# Patient Record
Sex: Female | Born: 1960
Health system: Southern US, Community
[De-identification: ages and names within clinical notes are randomized; demographics above are authoritative.]

## PROBLEM LIST (undated history)

## (undated) DIAGNOSIS — E785 Hyperlipidemia, unspecified: Secondary | ICD-10-CM

## (undated) DIAGNOSIS — J449 Chronic obstructive pulmonary disease, unspecified: Secondary | ICD-10-CM

## (undated) DIAGNOSIS — T7840XA Allergy, unspecified, initial encounter: Secondary | ICD-10-CM

## (undated) HISTORY — DX: Chronic obstructive pulmonary disease, unspecified: J44.9

## (undated) HISTORY — DX: Allergy, unspecified, initial encounter: T78.40XA

## (undated) HISTORY — PX: NO PAST SURGERIES: SHX2092

## (undated) HISTORY — DX: Hyperlipidemia, unspecified: E78.5

---

## 2007-07-14 ENCOUNTER — Other Ambulatory Visit: Payer: Self-pay

## 2007-07-14 ENCOUNTER — Emergency Department: Payer: Self-pay | Admitting: Emergency Medicine

## 2007-08-12 ENCOUNTER — Ambulatory Visit: Payer: Self-pay | Admitting: Family Medicine

## 2009-03-03 ENCOUNTER — Ambulatory Visit: Payer: Self-pay | Admitting: Unknown Physician Specialty

## 2009-03-18 ENCOUNTER — Ambulatory Visit: Payer: Self-pay | Admitting: Unknown Physician Specialty

## 2016-07-18 DIAGNOSIS — D2270 Melanocytic nevi of unspecified lower limb, including hip: Secondary | ICD-10-CM | POA: Diagnosis not present

## 2016-12-08 DIAGNOSIS — M545 Low back pain: Secondary | ICD-10-CM | POA: Diagnosis not present

## 2016-12-28 ENCOUNTER — Encounter: Payer: Self-pay | Admitting: Unknown Physician Specialty

## 2016-12-28 ENCOUNTER — Ambulatory Visit (INDEPENDENT_AMBULATORY_CARE_PROVIDER_SITE_OTHER): Payer: BLUE CROSS/BLUE SHIELD | Admitting: Unknown Physician Specialty

## 2016-12-28 DIAGNOSIS — F17200 Nicotine dependence, unspecified, uncomplicated: Secondary | ICD-10-CM | POA: Diagnosis not present

## 2016-12-28 DIAGNOSIS — E7800 Pure hypercholesterolemia, unspecified: Secondary | ICD-10-CM | POA: Insufficient documentation

## 2016-12-28 LAB — LIPID PANEL PICCOLO, WAIVED
CHOL/HDL RATIO PICCOLO,WAIVE: 2.7 mg/dL
Cholesterol Piccolo, Waived: 219 mg/dL — ABNORMAL HIGH (ref <200)
HDL CHOL PICCOLO, WAIVED: 82 mg/dL (ref >59)
LDL Chol Calc Piccolo Waived: 124 mg/dL — ABNORMAL HIGH (ref <100)
TRIGLYCERIDES PICCOLO,WAIVED: 68 mg/dL (ref <150)
VLDL Chol Calc Piccolo,Waive: 14 mg/dL (ref <30)

## 2016-12-28 NOTE — Assessment & Plan Note (Signed)
I have recommended absolute tobacco cessation. I have discussed various options available for assistance with tobacco cessation including over the counter methods (Nicotine gum, patch and lozenges). We also discussed prescription options (Chantix, Nicotine Inhaler / Nasal Spray). The patient is not interested in pursuing any prescription tobacco cessation options at this time.  

## 2016-12-28 NOTE — Progress Notes (Signed)
BP 117/77   Pulse 83   Temp 98.3 F (36.8 C)   Ht 5\' 3"  (1.6 m)   Wt 95 lb 1.6 oz (43.1 kg)   LMP 12/29/2007   SpO2 98%   BMI 16.85 kg/m    Subjective:    Patient ID: Natalie Lawson, female    DOB: 12-24-1960, 56 y.o.   MRN: [id number]  HPI: Natalie Lawson is a 56 y.o. female  Chief Complaint  Patient presents with  . Establish Care    no concerns or issues today, patient would like to come back for physical    Hypertension/hypercholesterol Pt states she had a health screening at work and found her cholesterol was a little high.   BP was also high and wanted to get it checked.  Pt states she is taking red yeast rice and going to the gym.  Taking Red Yeast Rice without problems.    Tobacco Smokes 1 ppd for quite some time.  Tried Nicorette lozenges which made her sick. No SOB or smokers cough  Social History   Social History  . Marital status: Single    Spouse name: N/A  . Number of children: N/A  . Years of education: N/A   Occupational History  . Not on file.   Social History Main Topics  . Smoking status: Current Every Day Smoker    Packs/day: 0.75    Types: Cigarettes  . Smokeless tobacco: Never Used  . Alcohol use No  . Drug use: No  . Sexual activity: No   Other Topics Concern  . Not on file   Social History Narrative  . No narrative on file   History reviewed. No pertinent family history.  History reviewed. No pertinent past medical history.  History reviewed. No pertinent surgical history.   Relevant past medical, surgical, family and social history reviewed and updated as indicated. Interim medical history since our last visit reviewed. Allergies and medications reviewed and updated.  Review of Systems  All other systems reviewed and are negative.   Per HPI unless specifically indicated above     Objective:    BP 117/77   Pulse 83   Temp 98.3 F (36.8 C)   Ht 5\' 3"  (1.6 m)   Wt 95 lb 1.6 oz (43.1 kg)   LMP 12/29/2007   SpO2 98%    BMI 16.85 kg/m   Wt Readings from Last 3 Encounters:  12/28/16 95 lb 1.6 oz (43.1 kg)    Physical Exam  Constitutional: She is oriented to person, place, and time. She appears well-developed and well-nourished. No distress.  HENT:  Head: Normocephalic and atraumatic.  Eyes: Conjunctivae and lids are normal. Right eye exhibits no discharge. Left eye exhibits no discharge. No scleral icterus.  Neck: Normal range of motion. Neck supple. No JVD present. Carotid bruit is not present.  Cardiovascular: Normal rate, regular rhythm and normal heart sounds.   Pulmonary/Chest: Effort normal and breath sounds normal.  Abdominal: Normal appearance. There is no splenomegaly or hepatomegaly.  Musculoskeletal: Normal range of motion.  Neurological: She is alert and oriented to person, place, and time.  Skin: Skin is warm, dry and intact. No rash noted. No pallor.  Psychiatric: She has a normal mood and affect. Her behavior is normal. Judgment and thought content normal.    No results found for this or any previous visit.    Assessment & Plan:   Problem List Items Addressed This Visit      Unprioritized  Hypercholesteremia    High at work.  Recheck today.  Already taking Red Yeast Rice.  ASCVD calculator not in statin benefit group      Relevant Orders   Lipid Panel Piccolo, Waived   Smoker     I have recommended absolute tobacco cessation. I have discussed various options available for assistance with tobacco cessation including over the counter methods (Nicotine gum, patch and lozenges). We also discussed prescription options (Chantix, Nicotine Inhaler / Nasal Spray). The patient is not interested in pursuing any prescription tobacco cessation options at this time.           Follow up plan: Return for schedule physical.

## 2016-12-28 NOTE — Assessment & Plan Note (Addendum)
High at work.  Recheck today.  Already taking Red Yeast Rice.  ASCVD calculator not in statin benefit group

## 2016-12-31 ENCOUNTER — Telehealth: Payer: Self-pay | Admitting: *Deleted

## 2016-12-31 ENCOUNTER — Encounter: Payer: Self-pay | Admitting: Unknown Physician Specialty

## 2016-12-31 NOTE — Telephone Encounter (Signed)
Received referral for low dose lung cancer screening CT scan. Message left at phone number listed in EMR for patient to call me back to facilitate scheduling scan.  

## 2017-01-01 ENCOUNTER — Telehealth: Payer: Self-pay | Admitting: *Deleted

## 2017-01-01 NOTE — Telephone Encounter (Signed)
Received referral for low dose lung cancer screening CT scan. Message left at phone number listed in EMR for patient to call me back to facilitate scheduling scan.  

## 2017-01-10 ENCOUNTER — Telehealth: Payer: Self-pay | Admitting: *Deleted

## 2017-01-10 NOTE — Telephone Encounter (Signed)
Received referral for low dose lung cancer screening CT scan. Message left at phone number listed in EMR for patient to call me back to facilitate scheduling scan.  

## 2017-01-18 ENCOUNTER — Encounter: Payer: Self-pay | Admitting: Unknown Physician Specialty

## 2017-01-18 ENCOUNTER — Ambulatory Visit (INDEPENDENT_AMBULATORY_CARE_PROVIDER_SITE_OTHER): Payer: BLUE CROSS/BLUE SHIELD | Admitting: Unknown Physician Specialty

## 2017-01-18 VITALS — BP 108/69 | HR 89 | Temp 98.1°F | Wt 95.2 lb

## 2017-01-18 DIAGNOSIS — Z0000 Encounter for general adult medical examination without abnormal findings: Secondary | ICD-10-CM | POA: Diagnosis not present

## 2017-01-18 DIAGNOSIS — R636 Underweight: Secondary | ICD-10-CM | POA: Diagnosis not present

## 2017-01-18 DIAGNOSIS — E7800 Pure hypercholesterolemia, unspecified: Secondary | ICD-10-CM | POA: Diagnosis not present

## 2017-01-18 DIAGNOSIS — F17200 Nicotine dependence, unspecified, uncomplicated: Secondary | ICD-10-CM

## 2017-01-18 NOTE — Assessment & Plan Note (Signed)
No change from previous weights

## 2017-01-18 NOTE — Progress Notes (Signed)
BP 108/69   Pulse 89   Temp 98.1 F (36.7 C) (Oral)   Wt 95 lb 3.2 oz (43.2 kg)   LMP 12/29/2007   SpO2 98%   BMI 16.86 kg/m    Subjective:    Patient ID: Natalie Lawson, female    DOB: 10/04/1960, 56 y.o.   MRN: [id number]  HPI: Natalie MemsSusan Nitta is a 56 y.o. female  Chief Complaint  Patient presents with  . Annual Exam    Hyperlipidemia Using medications (Red yeast rice) without problems: No Muscle aches  Diet compliance:Exercise: Going to the gym every day-   Social History   Socioeconomic History  . Marital status: Single    Spouse name: Not on file  . Number of children: Not on file  . Years of education: Not on file  . Highest education level: Not on file  Social Needs  . Financial resource strain: Not on file  . Food insecurity - worry: Not on file  . Food insecurity - inability: Not on file  . Transportation needs - medical: Not on file  . Transportation needs - non-medical: Not on file  Occupational History  . Not on file  Tobacco Use  . Smoking status: Current Every Day Smoker    Packs/day: 0.75    Types: Cigarettes  . Smokeless tobacco: Never Used  Substance and Sexual Activity  . Alcohol use: No  . Drug use: No  . Sexual activity: No  Other Topics Concern  . Not on file  Social History Narrative  . Not on file   History reviewed. No pertinent family history. History reviewed. No pertinent past medical history. History reviewed. No pertinent surgical history.  Relevant past medical, surgical, family and social history reviewed and updated as indicated. Interim medical history since our last visit reviewed. Allergies and medications reviewed and updated.  Review of Systems  Per HPI unless specifically indicated above     Objective:    BP 108/69   Pulse 89   Temp 98.1 F (36.7 C) (Oral)   Wt 95 lb 3.2 oz (43.2 kg)   LMP 12/29/2007   SpO2 98%   BMI 16.86 kg/m   Wt Readings from Last 3 Encounters:  01/18/17 95 lb 3.2 oz (43.2 kg)    12/28/16 95 lb 1.6 oz (43.1 kg)    Physical Exam  Constitutional: She is oriented to person, place, and time. She appears well-developed and well-nourished.  HENT:  Head: Normocephalic and atraumatic.  Eyes: Pupils are equal, round, and reactive to light. Right eye exhibits no discharge. Left eye exhibits no discharge. No scleral icterus.  Neck: Normal range of motion. Neck supple. Carotid bruit is not present. No thyromegaly present.  Cardiovascular: Normal rate, regular rhythm and normal heart sounds. Exam reveals no gallop and no friction rub.  No murmur heard. Pulmonary/Chest: Effort normal and breath sounds normal. No respiratory distress. She has no wheezes. She has no rales.  Abdominal: Soft. Bowel sounds are normal. There is no tenderness. There is no rebound.  Genitourinary: No breast swelling, tenderness or discharge.  Musculoskeletal: Normal range of motion.  Lymphadenopathy:    She has no cervical adenopathy.  Neurological: She is alert and oriented to person, place, and time.  Skin: Skin is warm, dry and intact. No rash noted.  Psychiatric: She has a normal mood and affect. Her speech is normal and behavior is normal. Judgment and thought content normal. Cognition and memory are normal.    Results for orders placed  or performed in visit on 12/28/16  Lipid Panel Piccolo, Arrow ElectronicsWaived  Result Value Ref Range   Cholesterol Piccolo, Waived 219 (H) <200 mg/dL   HDL Chol Piccolo, Waived 82 >59 mg/dL   Triglycerides Piccolo,Waived 68 <150 mg/dL   Chol/HDL Ratio Piccolo,Waive 2.7 mg/dL   LDL Chol Calc Piccolo Waived 124 (H) <100 mg/dL   VLDL Chol Calc Piccolo,Waive 14 <30 mg/dL      Assessment & Plan:   Problem List Items Addressed This Visit      Unprioritized   Hypercholesteremia    Check lipid panel      Smoker    Refusing low dose screening.   I have recommended absolute tobacco cessation. I have discussed various options available for assistance with tobacco cessation  including over the counter methods (Nicotine gum, patch and lozenges). We also discussed prescription options (Chantix, Nicotine Inhaler / Nasal Spray). The patient is not interested in pursuing any prescription tobacco cessation options at this time.       Underweight    No change from previous weights       Other Visit Diagnoses    Routine general medical examination at a health care facility    -  Primary   Relevant Orders   CBC with Differential/Platelet   Comprehensive metabolic panel   Lipid Panel w/o Chol/HDL Ratio   TSH   IGP, Aptima HPV, rfx 16/18,45   MM DIGITAL SCREENING BILATERAL       Follow up plan: Return in about 1 year (around 01/18/2018).

## 2017-01-18 NOTE — Assessment & Plan Note (Signed)
Check lipid panel  

## 2017-01-18 NOTE — Patient Instructions (Addendum)
Please do call to schedule your mammogram; the number to schedule one at either [facility] or [facility] is [phone]   Preventive Care 40-64 Years, Female Preventive care refers to lifestyle choices and visits with your health care provider that can promote health and wellness. What does preventive care include?  A yearly physical exam. This is also called an annual well check.  Dental exams once or twice a year.  Routine eye exams. Ask your health care provider how often you should have your eyes checked.  Personal lifestyle choices, including: ? Daily care of your teeth and gums. ? Regular physical activity. ? Eating a healthy diet. ? Avoiding tobacco and drug use. ? Limiting alcohol use. ? Practicing safe sex. ? Taking low-dose aspirin daily starting at age 3. ? Taking vitamin and mineral supplements as recommended by your health care provider. What happens during an annual well check? The services and screenings done by your health care provider during your annual well check will depend on your age, overall health, lifestyle risk factors, and family history of disease. Counseling Your health care provider may ask you questions about your:  Alcohol use.  Tobacco use.  Drug use.  Emotional well-being.  Home and relationship well-being.  Sexual activity.  Eating habits.  Work and work Statistician.  Method of birth control.  Menstrual cycle.  Pregnancy history.  Screening You may have the following tests or measurements:  Height, weight, and BMI.  Blood pressure.  Lipid and cholesterol levels. These may be checked every 5 years, or more frequently if you are over 82 years old.  Skin check.  Lung cancer screening. You may have this screening every year starting at age 68 if you have a 30-pack-year history of smoking and currently smoke or have quit within the past 15 years.  Fecal occult blood test (FOBT) of the stool.  You may have this test every year starting at age 36.  Flexible sigmoidoscopy or colonoscopy. You may have a sigmoidoscopy every 5 years or a colonoscopy every 10 years starting at age 7.  Hepatitis C blood test.  Hepatitis B blood test.  Sexually transmitted disease (STD) testing.  Diabetes screening. This is done by checking your blood sugar (glucose) after you have not eaten for a while (fasting). You may have this done every 1-3 years.  Mammogram. This may be done every 1-2 years. Talk to your health care provider about when you should start having regular mammograms. This may depend on whether you have a family history of breast cancer.  BRCA-related cancer screening. This may be done if you have a family history of breast, ovarian, tubal, or peritoneal cancers.  Pelvic exam and Pap test. This may be done every 3 years starting at age 14. Starting at age 29, this may be done every 5 years if you have a Pap test in combination with an HPV test.  Bone density scan. This is done to screen for osteoporosis. You may have this scan if you are at high risk for osteoporosis.  Discuss your test results, treatment options, and if necessary, the need for more tests with your health care provider. Vaccines Your health care provider may recommend certain vaccines, such as:  Influenza vaccine. This is recommended every year.  Tetanus, diphtheria, and acellular pertussis (Tdap, Td) vaccine. You may need a Td booster every 10 years.  Varicella vaccine. You may need this if you have not been vaccinated.  Zoster vaccine. You may  need this after age 11.  Measles, mumps, and rubella (MMR) vaccine. You may need at least one dose of MMR if you were born in 1957 or later. You may also need a second dose.  Pneumococcal 13-valent conjugate (PCV13) vaccine. You may need this if you have certain conditions and were not previously vaccinated.  Pneumococcal polysaccharide (PPSV23) vaccine. You may need  one or two doses if you smoke cigarettes or if you have certain conditions.  Meningococcal vaccine. You may need this if you have certain conditions.  Hepatitis A vaccine. You may need this if you have certain conditions or if you travel or work in places where you may be exposed to hepatitis A.  Hepatitis B vaccine. You may need this if you have certain conditions or if you travel or work in places where you may be exposed to hepatitis B.  Haemophilus influenzae type b (Hib) vaccine. You may need this if you have certain conditions.  Talk to your health care provider about which screenings and vaccines you need and how often you need them. This information is not intended to replace advice given to you by your health care provider. Make sure you discuss any questions you have with your health care provider. Document Released: 03/18/2015 Document Revised: 11/09/2015 Document Reviewed: 12/21/2014 Elsevier Interactive Patient Education  2017 [employer].

## 2017-01-18 NOTE — Assessment & Plan Note (Addendum)
Refusing low dose screening.   I have recommended absolute tobacco cessation. I have discussed various options available for assistance with tobacco cessation including over the counter methods (Nicotine gum, patch and lozenges). We also discussed prescription options (Chantix, Nicotine Inhaler / Nasal Spray). The patient is not interested in pursuing any prescription tobacco cessation options at this time.

## 2017-01-19 LAB — CBC WITH DIFFERENTIAL/PLATELET
BASOS: 0 %
Basophils Absolute: 0 10*3/uL (ref 0.0–0.2)
EOS (ABSOLUTE): 0.1 10*3/uL (ref 0.0–0.4)
Eos: 3 %
HEMATOCRIT: 37.1 % (ref 34.0–46.6)
Hemoglobin: 12.6 g/dL (ref 11.1–15.9)
IMMATURE GRANULOCYTES: 0 %
Immature Grans (Abs): 0 10*3/uL (ref 0.0–0.1)
LYMPHS ABS: 2 10*3/uL (ref 0.7–3.1)
Lymphs: 36 %
MCH: 28.1 pg (ref 26.6–33.0)
MCHC: 34 g/dL (ref 31.5–35.7)
MCV: 83 fL (ref 79–97)
MONOS ABS: 0.7 10*3/uL (ref 0.1–0.9)
Monocytes: 12 %
NEUTROS PCT: 49 %
Neutrophils Absolute: 2.8 10*3/uL (ref 1.4–7.0)
PLATELETS: 261 10*3/uL (ref 150–379)
RBC: 4.48 x10E6/uL (ref 3.77–5.28)
RDW: 16.8 % — ABNORMAL HIGH (ref 12.3–15.4)
WBC: 5.7 10*3/uL (ref 3.4–10.8)

## 2017-01-19 LAB — COMPREHENSIVE METABOLIC PANEL
ALT: 26 IU/L (ref 0–32)
AST: 35 IU/L (ref 0–40)
Albumin/Globulin Ratio: 1.9 (ref 1.2–2.2)
Albumin: 4.3 g/dL (ref 3.5–5.5)
Alkaline Phosphatase: 53 IU/L (ref 39–117)
BUN/Creatinine Ratio: 19 (ref 9–23)
BUN: 13 mg/dL (ref 6–24)
CALCIUM: 9.3 mg/dL (ref 8.7–10.2)
CHLORIDE: 101 mmol/L (ref 96–106)
CO2: 25 mmol/L (ref 20–29)
Creatinine, Ser: 0.7 mg/dL (ref 0.57–1.00)
GFR calc Af Amer: 113 mL/min/{1.73_m2} (ref >59)
GFR, EST NON AFRICAN AMERICAN: 98 mL/min/{1.73_m2} (ref >59)
GLOBULIN, TOTAL: 2.3 g/dL (ref 1.5–4.5)
Glucose: 85 mg/dL (ref 65–99)
Potassium: 4 mmol/L (ref 3.5–5.2)
SODIUM: 140 mmol/L (ref 134–144)
Total Protein: 6.6 g/dL (ref 6.0–8.5)

## 2017-01-19 LAB — LIPID PANEL W/O CHOL/HDL RATIO
CHOLESTEROL TOTAL: 207 mg/dL — AB (ref 100–199)
HDL: 74 mg/dL (ref >39)
LDL Calculated: 124 mg/dL — ABNORMAL HIGH (ref 0–99)
Triglycerides: 45 mg/dL (ref 0–149)
VLDL Cholesterol Cal: 9 mg/dL (ref 5–40)

## 2017-01-19 LAB — TSH: TSH: 3.33 u[IU]/mL (ref 0.450–4.500)

## 2017-01-21 LAB — IGP, APTIMA HPV, RFX 16/18,45
HPV APTIMA: NEGATIVE
PAP Smear Comment: 0

## 2017-01-22 ENCOUNTER — Encounter: Payer: Self-pay | Admitting: *Deleted

## 2017-02-03 DIAGNOSIS — R300 Dysuria: Secondary | ICD-10-CM | POA: Diagnosis not present

## 2017-02-03 DIAGNOSIS — F17200 Nicotine dependence, unspecified, uncomplicated: Secondary | ICD-10-CM | POA: Diagnosis not present

## 2017-02-03 DIAGNOSIS — N3000 Acute cystitis without hematuria: Secondary | ICD-10-CM | POA: Diagnosis not present

## 2017-08-22 DIAGNOSIS — Z008 Encounter for other general examination: Secondary | ICD-10-CM | POA: Diagnosis not present

## 2017-08-22 DIAGNOSIS — Z1331 Encounter for screening for depression: Secondary | ICD-10-CM | POA: Diagnosis not present

## 2017-08-22 DIAGNOSIS — Z1339 Encounter for screening examination for other mental health and behavioral disorders: Secondary | ICD-10-CM | POA: Diagnosis not present

## 2017-08-22 DIAGNOSIS — F17210 Nicotine dependence, cigarettes, uncomplicated: Secondary | ICD-10-CM | POA: Diagnosis not present

## 2017-08-22 DIAGNOSIS — Z716 Tobacco abuse counseling: Secondary | ICD-10-CM | POA: Diagnosis not present

## 2017-09-17 DIAGNOSIS — Z716 Tobacco abuse counseling: Secondary | ICD-10-CM | POA: Diagnosis not present

## 2017-09-17 DIAGNOSIS — Z008 Encounter for other general examination: Secondary | ICD-10-CM | POA: Diagnosis not present

## 2017-09-17 DIAGNOSIS — F17210 Nicotine dependence, cigarettes, uncomplicated: Secondary | ICD-10-CM | POA: Diagnosis not present

## 2017-09-17 DIAGNOSIS — Z719 Counseling, unspecified: Secondary | ICD-10-CM | POA: Diagnosis not present

## 2017-10-22 DIAGNOSIS — F17210 Nicotine dependence, cigarettes, uncomplicated: Secondary | ICD-10-CM | POA: Diagnosis not present

## 2017-10-22 DIAGNOSIS — Z716 Tobacco abuse counseling: Secondary | ICD-10-CM | POA: Diagnosis not present

## 2017-11-19 DIAGNOSIS — F17210 Nicotine dependence, cigarettes, uncomplicated: Secondary | ICD-10-CM | POA: Diagnosis not present

## 2017-11-19 DIAGNOSIS — F418 Other specified anxiety disorders: Secondary | ICD-10-CM | POA: Diagnosis not present

## 2017-11-19 DIAGNOSIS — Z716 Tobacco abuse counseling: Secondary | ICD-10-CM | POA: Diagnosis not present

## 2017-12-19 DIAGNOSIS — Z716 Tobacco abuse counseling: Secondary | ICD-10-CM | POA: Diagnosis not present

## 2017-12-19 DIAGNOSIS — F418 Other specified anxiety disorders: Secondary | ICD-10-CM | POA: Diagnosis not present

## 2017-12-19 DIAGNOSIS — F17210 Nicotine dependence, cigarettes, uncomplicated: Secondary | ICD-10-CM | POA: Diagnosis not present

## 2018-02-11 DIAGNOSIS — F17210 Nicotine dependence, cigarettes, uncomplicated: Secondary | ICD-10-CM | POA: Diagnosis not present

## 2018-02-11 DIAGNOSIS — Z716 Tobacco abuse counseling: Secondary | ICD-10-CM | POA: Diagnosis not present

## 2018-02-11 DIAGNOSIS — F418 Other specified anxiety disorders: Secondary | ICD-10-CM | POA: Diagnosis not present

## 2018-03-02 DIAGNOSIS — J209 Acute bronchitis, unspecified: Secondary | ICD-10-CM | POA: Diagnosis not present

## 2018-04-10 DIAGNOSIS — F418 Other specified anxiety disorders: Secondary | ICD-10-CM | POA: Diagnosis not present

## 2018-04-10 DIAGNOSIS — F17210 Nicotine dependence, cigarettes, uncomplicated: Secondary | ICD-10-CM | POA: Diagnosis not present

## 2018-04-10 DIAGNOSIS — Z716 Tobacco abuse counseling: Secondary | ICD-10-CM | POA: Diagnosis not present

## 2018-04-29 DIAGNOSIS — F418 Other specified anxiety disorders: Secondary | ICD-10-CM | POA: Diagnosis not present

## 2018-04-29 DIAGNOSIS — Z716 Tobacco abuse counseling: Secondary | ICD-10-CM | POA: Diagnosis not present

## 2018-04-29 DIAGNOSIS — F17210 Nicotine dependence, cigarettes, uncomplicated: Secondary | ICD-10-CM | POA: Diagnosis not present

## 2018-05-13 DIAGNOSIS — Z716 Tobacco abuse counseling: Secondary | ICD-10-CM | POA: Diagnosis not present

## 2018-05-13 DIAGNOSIS — F17210 Nicotine dependence, cigarettes, uncomplicated: Secondary | ICD-10-CM | POA: Diagnosis not present

## 2018-07-17 DIAGNOSIS — F17210 Nicotine dependence, cigarettes, uncomplicated: Secondary | ICD-10-CM | POA: Diagnosis not present

## 2018-07-17 DIAGNOSIS — F418 Other specified anxiety disorders: Secondary | ICD-10-CM | POA: Diagnosis not present

## 2018-07-17 DIAGNOSIS — Z716 Tobacco abuse counseling: Secondary | ICD-10-CM | POA: Diagnosis not present

## 2018-10-28 DIAGNOSIS — Z139 Encounter for screening, unspecified: Secondary | ICD-10-CM | POA: Diagnosis not present

## 2018-10-28 DIAGNOSIS — Z0130 Encounter for examination of blood pressure without abnormal findings: Secondary | ICD-10-CM | POA: Diagnosis not present

## 2018-11-06 DIAGNOSIS — E559 Vitamin D deficiency, unspecified: Secondary | ICD-10-CM | POA: Diagnosis not present

## 2018-11-06 DIAGNOSIS — E785 Hyperlipidemia, unspecified: Secondary | ICD-10-CM | POA: Diagnosis not present

## 2018-12-18 DIAGNOSIS — Z23 Encounter for immunization: Secondary | ICD-10-CM | POA: Diagnosis not present

## 2018-12-25 DIAGNOSIS — Z716 Tobacco abuse counseling: Secondary | ICD-10-CM | POA: Diagnosis not present

## 2018-12-25 DIAGNOSIS — F418 Other specified anxiety disorders: Secondary | ICD-10-CM | POA: Diagnosis not present

## 2018-12-25 DIAGNOSIS — F17210 Nicotine dependence, cigarettes, uncomplicated: Secondary | ICD-10-CM | POA: Diagnosis not present

## 2019-02-10 DIAGNOSIS — E785 Hyperlipidemia, unspecified: Secondary | ICD-10-CM | POA: Diagnosis not present

## 2019-02-10 DIAGNOSIS — Z0130 Encounter for examination of blood pressure without abnormal findings: Secondary | ICD-10-CM | POA: Diagnosis not present

## 2019-02-10 DIAGNOSIS — E559 Vitamin D deficiency, unspecified: Secondary | ICD-10-CM | POA: Diagnosis not present

## 2019-02-10 DIAGNOSIS — Z79899 Other long term (current) drug therapy: Secondary | ICD-10-CM | POA: Diagnosis not present

## 2019-02-19 DIAGNOSIS — E785 Hyperlipidemia, unspecified: Secondary | ICD-10-CM | POA: Diagnosis not present

## 2019-02-19 DIAGNOSIS — Z716 Tobacco abuse counseling: Secondary | ICD-10-CM | POA: Diagnosis not present

## 2019-02-19 DIAGNOSIS — F17210 Nicotine dependence, cigarettes, uncomplicated: Secondary | ICD-10-CM | POA: Diagnosis not present

## 2019-02-19 DIAGNOSIS — E559 Vitamin D deficiency, unspecified: Secondary | ICD-10-CM | POA: Diagnosis not present

## 2020-04-26 DIAGNOSIS — J90 Pleural effusion, not elsewhere classified: Secondary | ICD-10-CM | POA: Diagnosis not present

## 2020-04-26 DIAGNOSIS — R059 Cough, unspecified: Secondary | ICD-10-CM | POA: Diagnosis not present

## 2020-04-26 DIAGNOSIS — J189 Pneumonia, unspecified organism: Secondary | ICD-10-CM | POA: Diagnosis not present

## 2020-05-02 ENCOUNTER — Telehealth: Payer: Self-pay

## 2020-05-02 NOTE — Telephone Encounter (Signed)
Copied from CRM [id number]. Topic: General - Other >> Apr 26, 2020  8:40 AM Tamela Oddi wrote: Reason for CRM: Former patient, over 3 years, called to schedule an appt.  Please call patient back to schedule with new provider at [phone]   VM NOT SET UP CALLED TO TRY TO SCHDULE IF PT HAS BLUE CROSS PROVIDERS TAKING NEW PT ARE NOT CREDENTIALED TO SEE [facility]

## 2020-09-27 LAB — LIPID PANEL
Cholesterol: 197 (ref 0–200)
HDL: 105 — AB (ref 35–70)
LDL Cholesterol: 76
Triglycerides: 80 (ref 40–160)

## 2020-09-27 LAB — BASIC METABOLIC PANEL WITH GFR
BUN: 11 (ref 4–21)
CO2: 29 — AB (ref 13–22)
Chloride: 102 (ref 99–108)
Creatinine: 0.7 (ref 0.5–1.1)
Glucose: 98
Potassium: 4.5 (ref 3.4–5.3)
Sodium: 140 (ref 137–147)

## 2020-09-27 LAB — CBC: RBC: 4.83 (ref 3.87–5.11)

## 2020-09-27 LAB — COMPREHENSIVE METABOLIC PANEL
Albumin: 4.3 (ref 3.5–5.0)
Calcium: 9.9 (ref 8.7–10.7)
Globulin: 2.6

## 2020-09-27 LAB — VITAMIN D 25 HYDROXY (VIT D DEFICIENCY, FRACTURES): Vit D, 25-Hydroxy: 66

## 2020-09-27 LAB — CBC AND DIFFERENTIAL
HCT: 43 (ref 36–46)
Hemoglobin: 13.9 (ref 12.0–16.0)
Platelets: 283 (ref 150–399)
WBC: 9.1

## 2020-09-27 LAB — TSH: TSH: 3.54 (ref 0.41–5.90)

## 2020-09-27 LAB — VITAMIN B12: Vitamin B-12: 824

## 2020-09-29 DIAGNOSIS — Z1231 Encounter for screening mammogram for malignant neoplasm of breast: Secondary | ICD-10-CM | POA: Diagnosis not present

## 2020-11-01 ENCOUNTER — Ambulatory Visit (INDEPENDENT_AMBULATORY_CARE_PROVIDER_SITE_OTHER): Payer: BLUE CROSS/BLUE SHIELD | Admitting: Nurse Practitioner

## 2020-11-01 ENCOUNTER — Other Ambulatory Visit: Payer: Self-pay

## 2020-11-01 ENCOUNTER — Encounter: Payer: Self-pay | Admitting: Nurse Practitioner

## 2020-11-01 VITALS — BP 102/69 | HR 73 | Temp 98.1°F | Ht 62.5 in | Wt 84.2 lb

## 2020-11-01 DIAGNOSIS — F418 Other specified anxiety disorders: Secondary | ICD-10-CM

## 2020-11-01 DIAGNOSIS — E7800 Pure hypercholesterolemia, unspecified: Secondary | ICD-10-CM

## 2020-11-01 DIAGNOSIS — Z7689 Persons encountering health services in other specified circumstances: Secondary | ICD-10-CM

## 2020-11-01 DIAGNOSIS — E559 Vitamin D deficiency, unspecified: Secondary | ICD-10-CM

## 2020-11-01 DIAGNOSIS — R928 Other abnormal and inconclusive findings on diagnostic imaging of breast: Secondary | ICD-10-CM

## 2020-11-01 MED ORDER — ESCITALOPRAM OXALATE 20 MG PO TABS: 20.0000 mg | ORAL_TABLET | Freq: Every day | ORAL | 1 refills | Status: DC

## 2020-11-01 MED ORDER — ARIPIPRAZOLE 2 MG PO TABS: 2.0000 mg | ORAL_TABLET | Freq: Every day | ORAL | 1 refills | Status: DC

## 2020-11-01 NOTE — Progress Notes (Signed)
New Patient Office Visit  Subjective:  Patient ID: Natalie Lawson, female    DOB: 1960-09-26  Age: 60 y.o. MRN: [id number]  CC:  Chief Complaint  Patient presents with   Establish Care    Patient is here to establish care. Patient states she had a Mammogram performed in July 28th and they are requesting additional imaging for the patient. Patient is here to discuss her results as she was not informed other than via letter. Patient would like to discuss if she needs an referral or just to call and make an appointment to follow up.    Hyperlipidemia    Patient states she is currently taking Crestor and when she takes the medication in the morning she has a feeling of being nauseous and light headed and thinks that the dosage is too high and need to discuss cutting it back down to 10 mg.     HPI Natalie Lawson presents for new patient visit to establish care.  Introduced to Publishing rights manager role and practice setting.  All questions answered.  Discussed provider/patient relationship and expectations. She recently had a screening mammogram that showed asymmetry and calcifications in her left breast. She was recommended to have further imaging. She has also endorsed increasing depression and not having interest in many things. She takes lexapro for anxiety and this is well controlled.    DEPRESSION  Mood status: uncontrolled Satisfied with current treatment?: no Symptom severity: mild  Duration of current treatment : chronic Side effects: no Medication compliance: excellent compliance Psychotherapy/counseling: no  Previous psychiatric medications: lexapro Depressed mood: yes Anxious mood: no Anhedonia: yes Significant weight loss or gain: yes Insomnia: no  Fatigue: yes Feelings of worthlessness or guilt: yes Impaired concentration/indecisiveness: yes Suicidal ideations: no Hopelessness: no Crying spells: no Depression screen [facility] 2/9 11/01/2020 12/28/2016  Decreased Interest 3 0  Down,  Depressed, Hopeless 0 0  PHQ - 2 Score 3 0  Altered sleeping 3 0  Tired, decreased energy 3 1  Change in appetite 0 0  Feeling bad or failure about yourself  0 0  Trouble concentrating 3 0  Moving slowly or fidgety/restless 0 0  Suicidal thoughts 0 0  PHQ-9 Score 12 1  Difficult doing work/chores Somewhat difficult -     Past Medical History:  Diagnosis Date   Hyperlipidemia     Past Surgical History:  Procedure Laterality Date   NO PAST SURGERIES      Family History  Problem Relation Age of Onset   Brain cancer Mother    Asthma Father    Breast cancer Sister    Healthy Sister    Cancer Brother     Social History   Socioeconomic History   Marital status: Single    Spouse name: Not on file   Number of children: Not on file   Years of education: Not on file   Highest education level: Not on file  Occupational History   Not on file  Tobacco Use   Smoking status: Every Day    Packs/day: 0.75    Types: Cigarettes   Smokeless tobacco: Never  Vaping Use   Vaping Use: Never used  Substance and Sexual Activity   Alcohol use: No   Drug use: No   Sexual activity: Never  Other Topics Concern   Not on file  Social History Narrative   Not on file   Social Determinants of Health   Financial Resource Strain: Not on file  Food Insecurity: Not on  file  Transportation Needs: Not on file  Physical Activity: Not on file  Stress: Not on file  Social Connections: Not on file  Intimate Partner Violence: Not on file    ROS Review of Systems  Constitutional:  Positive for fatigue.  HENT: Negative.    Respiratory: Negative.    Cardiovascular: Negative.   Gastrointestinal: Negative.   Genitourinary: Negative.   Musculoskeletal: Negative.   Skin: Negative.   Neurological:  Positive for dizziness. Negative for light-headedness and headaches.  Psychiatric/Behavioral:  Positive for dysphoric mood.    Objective:   Today's Vitals: BP 102/69   Pulse 73   Temp 98.1  F (36.7 C) (Oral)   Ht 5' 2.5" (1.588 m)   Wt 84 lb 3.2 oz (38.2 kg)   LMP 12/29/2007   SpO2 97%   BMI 15.15 kg/m   Physical Exam Vitals and nursing note reviewed.  Constitutional:      General: She is not in acute distress.    Appearance: Normal appearance.  HENT:     Head: Normocephalic.  Eyes:     Conjunctiva/sclera: Conjunctivae normal.  Cardiovascular:     Rate and Rhythm: Normal rate and regular rhythm.     Pulses: Normal pulses.     Heart sounds: Normal heart sounds.  Pulmonary:     Effort: Pulmonary effort is normal.     Breath sounds: Normal breath sounds.  Abdominal:     Tenderness: There is no abdominal tenderness.  Musculoskeletal:     Cervical back: Normal range of motion.  Skin:    General: Skin is warm.  Neurological:     General: No focal deficit present.     Mental Status: She is alert and oriented to person, place, and time.  Psychiatric:        Mood and Affect: Mood normal.        Behavior: Behavior normal.        Thought Content: Thought content normal.        Judgment: Judgment normal.    Assessment & Plan:   Problem List Items Addressed This Visit       Other   Hypercholesteremia    Chronic, stable. Well controlled on crestor. Recent labs reviewed. Continue current regimen.       Relevant Medications   rosuvastatin (CRESTOR) 20 MG tablet   Mixed anxiety and depressive disorder    Chronic, ongoing. Anxiety well controlled however depression is not. PHQ 9 today was 12. Denies SI/HI. Will add on abilify. F/U in 4-6 weeks or sooner with any concerns or side effects.       Relevant Medications   escitalopram (LEXAPRO) 20 MG tablet   Vitamin D deficiency    Chronic, well controlled. Continue vitamin D supplement.       Other Visit Diagnoses     Abnormal mammogram    -  Primary   Diagnostic mammogram ordered today.    Relevant Orders   MM DIAG BREAST TOMO BILATERAL   Encounter to establish care           Outpatient Encounter  Medications as of 11/01/2020  Medication Sig   ARIPiprazole (ABILIFY) 2 MG tablet Take 1 tablet (2 mg total) by mouth at bedtime.   Multiple Vitamin (MULTIVITAMIN) tablet Take 1 tablet by mouth daily.   rosuvastatin (CRESTOR) 20 MG tablet Take 1 tablet by mouth daily.   Vitamin D, Cholecalciferol, 25 MCG (1000 UT) CAPS 1 capsule   [DISCONTINUED] escitalopram (LEXAPRO) 20 MG tablet Take  20 mg by mouth daily.   escitalopram (LEXAPRO) 20 MG tablet Take 1 tablet (20 mg total) by mouth daily.   [DISCONTINUED] Red Yeast Rice 600 MG TABS Take 2 tablets by mouth daily. (Patient not taking: Reported on 11/01/2020)   No facility-administered encounter medications on file as of 11/01/2020.    Follow-up: Return in about 4 weeks (around 11/29/2020) for physical.   Gerre Scull, NP

## 2020-11-01 NOTE — Patient Instructions (Signed)
[facility]  Address: [address], [place], [place] [address]  Phone: [phone]  

## 2020-11-01 NOTE — Assessment & Plan Note (Signed)
Chronic, ongoing. Anxiety well controlled however depression is not. PHQ 9 today was 12. Denies SI/HI. Will add on abilify. F/U in 4-6 weeks or sooner with any concerns or side effects.

## 2020-11-01 NOTE — Assessment & Plan Note (Signed)
Chronic, stable. Well controlled on crestor. Recent labs reviewed. Continue current regimen.

## 2020-11-01 NOTE — Assessment & Plan Note (Signed)
Chronic, well controlled. Continue vitamin D supplement.

## 2020-11-21 ENCOUNTER — Other Ambulatory Visit: Payer: Self-pay | Admitting: Nurse Practitioner

## 2020-11-21 DIAGNOSIS — R921 Mammographic calcification found on diagnostic imaging of breast: Secondary | ICD-10-CM

## 2020-11-21 DIAGNOSIS — N6489 Other specified disorders of breast: Secondary | ICD-10-CM

## 2020-11-23 ENCOUNTER — Other Ambulatory Visit: Payer: Self-pay | Admitting: *Deleted

## 2020-11-23 ENCOUNTER — Inpatient Hospital Stay
Admission: RE | Admit: 2020-11-23 | Discharge: 2020-11-23 | Disposition: A | Discharge status: Home / Self Care | Payer: Self-pay | Source: Ambulatory Visit | Attending: *Deleted | Admitting: *Deleted

## 2020-11-23 DIAGNOSIS — Z1231 Encounter for screening mammogram for malignant neoplasm of breast: Secondary | ICD-10-CM

## 2020-11-29 ENCOUNTER — Ambulatory Visit
Admission: RE | Admit: 2020-11-29 | Discharge: 2020-11-29 | Disposition: A | Discharge status: Home / Self Care | Payer: BC Managed Care – PPO | Source: Ambulatory Visit | Attending: Nurse Practitioner | Admitting: Nurse Practitioner

## 2020-11-29 ENCOUNTER — Other Ambulatory Visit: Payer: Self-pay

## 2020-11-29 DIAGNOSIS — R922 Inconclusive mammogram: Secondary | ICD-10-CM | POA: Diagnosis not present

## 2020-11-29 DIAGNOSIS — R921 Mammographic calcification found on diagnostic imaging of breast: Secondary | ICD-10-CM | POA: Insufficient documentation

## 2020-11-29 DIAGNOSIS — R928 Other abnormal and inconclusive findings on diagnostic imaging of breast: Secondary | ICD-10-CM | POA: Diagnosis not present

## 2020-11-29 DIAGNOSIS — N6489 Other specified disorders of breast: Secondary | ICD-10-CM | POA: Insufficient documentation

## 2020-12-13 DIAGNOSIS — Z23 Encounter for immunization: Secondary | ICD-10-CM | POA: Diagnosis not present

## 2020-12-29 NOTE — Progress Notes (Signed)
BP 118/76   Pulse 72   Temp 97.9 F (36.6 C) (Oral)   Ht 5' 3.03" (1.601 m)   Wt 86 lb 12.8 oz (39.4 kg)   LMP 12/29/2007   SpO2 100%   BMI 15.36 kg/m    Subjective:    Patient ID: Natalie Lawson, female    DOB: 1961-01-02, 60 y.o.   MRN: [id number]  CC: Chief Complaint  Patient presents with   Annual Exam    HPI: Natalie Lawson is a 60 y.o. female presenting on 12/30/2020 for comprehensive medical examination. Current medical complaints include:none  Doing well since adding abilify, she has noticed a big improvement.   She currently lives with: Daughter Menopausal Symptoms: no  Depression Screen done today and results listed below:  Depression screen [facility] 2/9 12/30/2020 11/01/2020 12/28/2016  Decreased Interest 0 3 0  Down, Depressed, Hopeless 0 0 0  PHQ - 2 Score 0 3 0  Altered sleeping 0 3 0  Tired, decreased energy 0 3 1  Change in appetite 0 0 0  Feeling bad or failure about yourself  0 0 0  Trouble concentrating 0 3 0  Moving slowly or fidgety/restless 0 0 0  Suicidal thoughts 0 0 0  PHQ-9 Score 0 12 1  Difficult doing work/chores Not difficult at all Somewhat difficult -    The patient does not have a history of falls. I did not complete a risk assessment for falls. A plan of care for falls was not documented.   Past Medical History:  Past Medical History:  Diagnosis Date   Hyperlipidemia     Surgical History:  Past Surgical History:  Procedure Laterality Date   NO PAST SURGERIES      Medications:  Current Outpatient Medications on File Prior to Visit  Medication Sig   escitalopram (LEXAPRO) 20 MG tablet Take 1 tablet (20 mg total) by mouth daily.   Multiple Vitamin (MULTIVITAMIN) tablet Take 1 tablet by mouth daily.   rosuvastatin (CRESTOR) 20 MG tablet Take 1 tablet by mouth daily.   Vitamin D, Cholecalciferol, 25 MCG (1000 UT) CAPS 1 capsule   No current facility-administered medications on file prior to visit.    Allergies:  No Known  Allergies  Social History:  Social History   Socioeconomic History   Marital status: Single    Spouse name: Not on file   Number of children: Not on file   Years of education: Not on file   Highest education level: Not on file  Occupational History   Not on file  Tobacco Use   Smoking status: Every Day    Packs/day: 0.75    Types: Cigarettes   Smokeless tobacco: Never  Vaping Use   Vaping Use: Never used  Substance and Sexual Activity   Alcohol use: No   Drug use: No   Sexual activity: Never  Other Topics Concern   Not on file  Social History Narrative   Not on file   Social Determinants of Health   Financial Resource Strain: Not on file  Food Insecurity: Not on file  Transportation Needs: Not on file  Physical Activity: Not on file  Stress: Not on file  Social Connections: Not on file  Intimate Partner Violence: Not on file   Social History   Tobacco Use  Smoking Status Every Day   Packs/day: 0.75   Types: Cigarettes  Smokeless Tobacco Never   Social History   Substance and Sexual Activity  Alcohol Use No  Family History:  Family History  Problem Relation Age of Onset   Brain cancer Mother    Asthma Father    Breast cancer Sister    Healthy Sister    Cancer Brother     Past medical history, surgical history, medications, allergies, family history and social history reviewed with patient today and changes made to appropriate areas of the chart.   Review of Systems  Constitutional: Negative.   HENT: Negative.    Eyes: Negative.   Respiratory:  Positive for cough and shortness of breath (intermittent, chronic).   Cardiovascular: Negative.   Gastrointestinal: Negative.   Genitourinary: Negative.   Musculoskeletal: Negative.   Skin: Negative.   Neurological: Negative.   Psychiatric/Behavioral: Negative.    All other ROS negative except what is listed above and in the HPI.      Objective:    BP 118/76   Pulse 72   Temp 97.9 F (36.6 C)  (Oral)   Ht 5' 3.03" (1.601 m)   Wt 86 lb 12.8 oz (39.4 kg)   LMP 12/29/2007   SpO2 100%   BMI 15.36 kg/m   Wt Readings from Last 3 Encounters:  12/30/20 86 lb 12.8 oz (39.4 kg)  11/01/20 84 lb 3.2 oz (38.2 kg)  01/18/17 95 lb 3.2 oz (43.2 kg)    Physical Exam Vitals and nursing note reviewed. Exam conducted with a chaperone present.  Constitutional:      General: She is not in acute distress.    Appearance: Normal appearance.  HENT:     Head: Normocephalic and atraumatic.     Right Ear: Tympanic membrane, ear canal and external ear normal.     Left Ear: Tympanic membrane, ear canal and external ear normal.     Nose: Nose normal.     Mouth/Throat:     Mouth: Mucous membranes are moist.     Pharynx: Oropharynx is clear.  Eyes:     Conjunctiva/sclera: Conjunctivae normal.  Cardiovascular:     Rate and Rhythm: Normal rate and regular rhythm.     Pulses: Normal pulses.     Heart sounds: Normal heart sounds.  Pulmonary:     Effort: Pulmonary effort is normal.     Breath sounds: Normal breath sounds.  Abdominal:     General: Bowel sounds are normal.     Palpations: Abdomen is soft.     Tenderness: There is no abdominal tenderness.  Genitourinary:    General: Normal vulva.     Exam position: Lithotomy position.     Vagina: Normal.     Cervix: Normal.     Uterus: Normal.      Adnexa: Right adnexa normal and left adnexa normal.  Musculoskeletal:        General: Normal range of motion.     Cervical back: Normal range of motion and neck supple. No tenderness.     Comments: Strength 5/5 in bilateral upper and lower extremities   Lymphadenopathy:     Cervical: No cervical adenopathy.  Skin:    General: Skin is warm and dry.  Neurological:     General: No focal deficit present.     Mental Status: She is alert and oriented to person, place, and time.     Cranial Nerves: No cranial nerve deficit.     Coordination: Coordination normal.     Gait: Gait normal.  Psychiatric:         Mood and Affect: Mood normal.  Behavior: Behavior normal.        Thought Content: Thought content normal.        Judgment: Judgment normal.    Results for orders placed or performed in visit on 11/01/20  CBC and differential  Result Value Ref Range   Hemoglobin 13.9 12.0 - 16.0   HCT 43 36 - 46   Platelets 283 150 - 399   WBC 9.1   CBC  Result Value Ref Range   RBC 4.83 3.87 - 5.11  VITAMIN D 25 Hydroxy (Vit-D Deficiency, Fractures)  Result Value Ref Range   Vit D, 25-Hydroxy 66   Basic metabolic panel  Result Value Ref Range   Glucose 98    BUN 11 4 - 21   CO2 29 (A) 13 - 22   Creatinine 0.7 0.5 - 1.1   Potassium 4.5 3.4 - 5.3   Sodium 140 137 - 147   Chloride 102 99 - 108  Comprehensive metabolic panel  Result Value Ref Range   Globulin 2.6    Calcium 9.9 8.7 - 10.7   Albumin 4.3 3.5 - 5.0  Lipid panel  Result Value Ref Range   Triglycerides 80 40 - 160   Cholesterol 197 0 - 200   HDL 105 (A) 35 - 70   LDL Cholesterol 76   Vitamin B12  Result Value Ref Range   Vitamin B-12 824   TSH  Result Value Ref Range   TSH 3.54 0.41 - 5.90      Assessment & Plan:   Problem List Items Addressed This Visit       Other   Smoker    Discussed complete tobacco cessation, she is not interested at this time. Also declined low dose CT lung scan.       Hypercholesteremia    Chronic, stable. Continue crestor. Check lipid panel today and adjust regimen as needed.       Relevant Orders   Lipid Panel w/o Chol/HDL Ratio   Underweight    She has gained 2 pounds since last visit. Discussed supplementing meals with ensure or boost. Her goal is to gain 10 pounds. Follow up in 6 months.       Mixed anxiety and depressive disorder    Chronic, well controlled with the addition of abilify. PHQ 9 is a 0 today. Refills sent to the pharmacy. Follow up in 6 months.       Other Visit Diagnoses     Routine general medical examination at a health care facility    -   Primary   Health maintenance reviewed and updated. Check CMP, CBC, TSH, and pap today. Diagnostic mammogram in 6 months.    Relevant Orders   CBC with Differential/Platelet   Comprehensive metabolic panel   Cytology - PAP   Screening for HIV (human immunodeficiency virus)       Screen HIV today   Relevant Orders   HIV Antibody (routine testing w rflx)   Encounter for hepatitis C screening test for low risk patient       Screen hepatitis C today    Relevant Orders   Hepatitis C Antibody   Need for tetanus booster       Td given today   Relevant Orders   Td : Tetanus/diphtheria >7yo Preservative  free (Completed)   Need for pneumococcal vaccination       Pneumovax 23 given today   Relevant Orders   Pneumococcal polysaccharide vaccine 23-valent greater than or equal  to 2yo subcutaneous/IM (Completed)   Abnormal mammogram       Diagnostic mammogram showed possible benign breast calcifications in left breast and recommended repeat mammogram in March 2023. Order placed   Relevant Orders   MM Digital Diagnostic Unilat L        Follow up plan: Return in about 6 months (around 06/30/2021) for mood, hld.   LABORATORY TESTING:  - Pap smear: pap done  IMMUNIZATIONS:   - Tdap: Tetanus vaccination status reviewed: Td vaccination indicated and given today. - Influenza: Up to date - Pneumovax: Administered today - Prevnar: Not applicable - HPV: Not applicable - Zostavax vaccine: Refused  SCREENING: -Mammogram: Up to date  - Colonoscopy: Refused - information on colonoscopy and cologuard provided today - Bone Density: Not applicable  -Hearing Test: Not applicable  -Spirometry: Not applicable   PATIENT COUNSELING:   Advised to take 1 mg of folate supplement per day if capable of pregnancy.   Sexuality: Discussed sexually transmitted diseases, partner selection, use of condoms, avoidance of unintended pregnancy  and contraceptive alternatives.   Advised to avoid cigarette  smoking.  I discussed with the patient that most people either abstain from alcohol or drink within safe limits (<=14/week and <=4 drinks/occasion for males, <=7/weeks and <= 3 drinks/occasion for females) and that the risk for alcohol disorders and other health effects rises proportionally with the number of drinks per week and how often a drinker exceeds daily limits.  Discussed cessation/primary prevention of drug use and availability of treatment for abuse.   Diet: Encouraged to adjust caloric intake to maintain  or achieve ideal body weight, to reduce intake of dietary saturated fat and total fat, to limit sodium intake by avoiding high sodium foods and not adding table salt, and to maintain adequate dietary potassium and calcium preferably from fresh fruits, vegetables, and low-fat dairy products.    stressed the importance of regular exercise  Injury prevention: Discussed safety belts, safety helmets, smoke detector, smoking near bedding or upholstery.   Dental health: Discussed importance of regular tooth brushing, flossing, and dental visits.    NEXT PREVENTATIVE PHYSICAL DUE IN 1 YEAR. Return in about 6 months (around 06/30/2021) for mood, hld.

## 2020-12-30 ENCOUNTER — Encounter: Payer: Self-pay | Admitting: Nurse Practitioner

## 2020-12-30 ENCOUNTER — Other Ambulatory Visit (HOSPITAL_COMMUNITY)
Admission: RE | Admit: 2020-12-30 | Discharge: 2020-12-30 | Disposition: A | Discharge status: Home / Self Care | Payer: BC Managed Care – PPO | Source: Ambulatory Visit | Attending: Nurse Practitioner | Admitting: Nurse Practitioner

## 2020-12-30 ENCOUNTER — Ambulatory Visit (INDEPENDENT_AMBULATORY_CARE_PROVIDER_SITE_OTHER): Payer: BC Managed Care – PPO | Admitting: Nurse Practitioner

## 2020-12-30 ENCOUNTER — Other Ambulatory Visit: Payer: Self-pay

## 2020-12-30 VITALS — BP 118/76 | HR 72 | Temp 97.9°F | Ht 63.03 in | Wt 86.8 lb

## 2020-12-30 DIAGNOSIS — F17200 Nicotine dependence, unspecified, uncomplicated: Secondary | ICD-10-CM | POA: Diagnosis not present

## 2020-12-30 DIAGNOSIS — Z114 Encounter for screening for human immunodeficiency virus [HIV]: Secondary | ICD-10-CM | POA: Diagnosis not present

## 2020-12-30 DIAGNOSIS — Z1159 Encounter for screening for other viral diseases: Secondary | ICD-10-CM | POA: Diagnosis not present

## 2020-12-30 DIAGNOSIS — R928 Other abnormal and inconclusive findings on diagnostic imaging of breast: Secondary | ICD-10-CM

## 2020-12-30 DIAGNOSIS — Z23 Encounter for immunization: Secondary | ICD-10-CM

## 2020-12-30 DIAGNOSIS — Z0000 Encounter for general adult medical examination without abnormal findings: Secondary | ICD-10-CM | POA: Insufficient documentation

## 2020-12-30 DIAGNOSIS — F418 Other specified anxiety disorders: Secondary | ICD-10-CM

## 2020-12-30 DIAGNOSIS — E7800 Pure hypercholesterolemia, unspecified: Secondary | ICD-10-CM

## 2020-12-30 DIAGNOSIS — R636 Underweight: Secondary | ICD-10-CM

## 2020-12-30 MED ORDER — ARIPIPRAZOLE 2 MG PO TABS: 2.0000 mg | ORAL_TABLET | Freq: Every day | ORAL | 1 refills | Status: DC

## 2020-12-30 NOTE — Patient Instructions (Signed)
[facility] at [facility]  Address: [address] [place], [place], [place] [address]  Phone: [phone]  Call to set up mammogram in March 2023

## 2020-12-30 NOTE — Assessment & Plan Note (Signed)
Chronic, well controlled with the addition of abilify. PHQ 9 is a 0 today. Refills sent to the pharmacy. Follow up in 6 months.

## 2020-12-30 NOTE — Assessment & Plan Note (Signed)
She has gained 2 pounds since last visit. Discussed supplementing meals with ensure or boost. Her goal is to gain 10 pounds. Follow up in 6 months.

## 2020-12-30 NOTE — Assessment & Plan Note (Signed)
Chronic, stable. Continue crestor. Check lipid panel today and adjust regimen as needed.

## 2020-12-30 NOTE — Assessment & Plan Note (Signed)
Discussed complete tobacco cessation, she is not interested at this time. Also declined low dose CT lung scan.

## 2020-12-31 LAB — COMPREHENSIVE METABOLIC PANEL
ALT: 47 IU/L — ABNORMAL HIGH (ref 0–32)
AST: 47 IU/L — ABNORMAL HIGH (ref 0–40)
Albumin/Globulin Ratio: 1.9 (ref 1.2–2.2)
Albumin: 4.6 g/dL (ref 3.8–4.9)
Alkaline Phosphatase: 52 IU/L (ref 44–121)
BUN/Creatinine Ratio: 12 (ref 9–23)
BUN: 7 mg/dL (ref 6–24)
Bilirubin Total: 0.3 mg/dL (ref 0.0–1.2)
CO2: 27 mmol/L (ref 20–29)
Calcium: 9.8 mg/dL (ref 8.7–10.2)
Chloride: 100 mmol/L (ref 96–106)
Creatinine, Ser: 0.6 mg/dL (ref 0.57–1.00)
Globulin, Total: 2.4 g/dL (ref 1.5–4.5)
Glucose: 92 mg/dL (ref 70–99)
Potassium: 4.6 mmol/L (ref 3.5–5.2)
Sodium: 142 mmol/L (ref 134–144)
Total Protein: 7 g/dL (ref 6.0–8.5)
eGFR: 103 mL/min/{1.73_m2} (ref >59)

## 2020-12-31 LAB — CBC WITH DIFFERENTIAL/PLATELET
Basophils Absolute: 0 10*3/uL (ref 0.0–0.2)
Basos: 0 %
EOS (ABSOLUTE): 0.3 10*3/uL (ref 0.0–0.4)
Eos: 3 %
Hematocrit: 43.1 % (ref 34.0–46.6)
Hemoglobin: 13.7 g/dL (ref 11.1–15.9)
Immature Grans (Abs): 0 10*3/uL (ref 0.0–0.1)
Immature Granulocytes: 0 %
Lymphocytes Absolute: 2.9 10*3/uL (ref 0.7–3.1)
Lymphs: 29 %
MCH: 28.2 pg (ref 26.6–33.0)
MCHC: 31.8 g/dL (ref 31.5–35.7)
MCV: 89 fL (ref 79–97)
Monocytes Absolute: 0.6 10*3/uL (ref 0.1–0.9)
Monocytes: 6 %
Neutrophils Absolute: 6.1 10*3/uL (ref 1.4–7.0)
Neutrophils: 62 %
Platelets: 322 10*3/uL (ref 150–450)
RBC: 4.86 x10E6/uL (ref 3.77–5.28)
RDW: 13.4 % (ref 11.7–15.4)
WBC: 9.8 10*3/uL (ref 3.4–10.8)

## 2020-12-31 LAB — HEPATITIS C ANTIBODY: Hep C Virus Ab: <0.1 s/co ratio (ref 0.0–0.9)

## 2020-12-31 LAB — LIPID PANEL W/O CHOL/HDL RATIO
Cholesterol, Total: 211 mg/dL — ABNORMAL HIGH (ref 100–199)
HDL: 105 mg/dL (ref >39)
LDL Chol Calc (NIH): 93 mg/dL (ref 0–99)
Triglycerides: 74 mg/dL (ref 0–149)
VLDL Cholesterol Cal: 13 mg/dL (ref 5–40)

## 2020-12-31 LAB — HIV ANTIBODY (ROUTINE TESTING W REFLEX): HIV Screen 4th Generation wRfx: NONREACTIVE

## 2021-01-02 LAB — CYTOLOGY - PAP
Comment: NEGATIVE
Diagnosis: NEGATIVE
High risk HPV: NEGATIVE

## 2021-04-11 ENCOUNTER — Encounter: Payer: Self-pay | Admitting: Nurse Practitioner

## 2021-06-26 ENCOUNTER — Other Ambulatory Visit: Payer: Self-pay | Admitting: Nurse Practitioner

## 2021-06-26 DIAGNOSIS — R921 Mammographic calcification found on diagnostic imaging of breast: Secondary | ICD-10-CM

## 2021-06-27 ENCOUNTER — Other Ambulatory Visit: Payer: Self-pay | Admitting: Nurse Practitioner

## 2021-06-28 NOTE — Telephone Encounter (Signed)
Patient last seen in October, has appointment next month  ?

## 2021-06-30 ENCOUNTER — Ambulatory Visit: Payer: BC Managed Care – PPO | Admitting: Nurse Practitioner

## 2021-07-06 ENCOUNTER — Ambulatory Visit: Payer: BC Managed Care – PPO | Admitting: Nurse Practitioner

## 2021-07-11 ENCOUNTER — Ambulatory Visit
Admission: RE | Admit: 2021-07-11 | Discharge: 2021-07-11 | Disposition: A | Discharge status: Home / Self Care | Payer: BC Managed Care – PPO | Source: Ambulatory Visit | Attending: Nurse Practitioner | Admitting: Nurse Practitioner

## 2021-07-11 DIAGNOSIS — R921 Mammographic calcification found on diagnostic imaging of breast: Secondary | ICD-10-CM | POA: Insufficient documentation

## 2021-07-12 NOTE — Progress Notes (Signed)
Please let patient know that the radiologist recommended she have a diagnostic mammogram in July. It looks like they have already discussed this with her. Just make sure she has an appt.

## 2021-07-13 ENCOUNTER — Ambulatory Visit: Payer: BC Managed Care – PPO | Admitting: Nurse Practitioner

## 2021-07-13 NOTE — Progress Notes (Signed)
? ?BP 121/80   Pulse 88   Temp 98.1 ?F (36.7 ?C) (Oral)   Wt 88 lb 3.2 oz (40 kg)   LMP 12/29/2007   SpO2 93%   BMI 15.61 kg/m?   ? ?Subjective:  ? ? Patient ID: Natalie Lawson, female    DOB: [birth year], 61 y.o.   MRN: [id number] ? ?HPI: ?Natalie Lawson is a 61 y.o. female ? ?Chief Complaint  ?Patient presents with  ? Hyperlipidemia  ? Mood  ?  Follow up  ?Patient would like to discuss ?fungal infection on L big toe   ? ?HYPERLIPIDEMIA ?Hyperlipidemia status: excellent compliance ?Satisfied with current treatment?  yes ?Side effects:  no ?Medication compliance: excellent compliance ?Past cholesterol meds: rosuvastatin (crestor) ?Supplements: none ?Aspirin:  no ?The ASCVD Risk score (Arnett DK, et al., 2019) failed to calculate for the following reasons: ?  The valid HDL cholesterol range is 20 to 100 mg/dL ?Chest pain:  no ?Coronary artery disease:  no ?Family history CAD:  no ?Family history early CAD:  no ?She feels like her brain is cloudy when she takes the full 31m.  She has been cutting it in half and tolerating it well.  ? ? ?MOOD ?Patient states she feels like its a little better. She feels like the Abilify isn't quite enough.  She would like to increase that dose. Denies SI.  ? ? ?  07/14/2021  ?  8:53 AM 12/30/2020  ?  8:25 AM  ?GAD 7 : Generalized Anxiety Score  ?Nervous, Anxious, on Edge 1 0  ?Control/stop worrying 0 0  ?Worry too much - different things 0 0  ?Trouble relaxing 0 0  ?Restless 0 0  ?Easily annoyed or irritable 1 0  ?Afraid - awful might happen 0 0  ?Total GAD 7 Score 2 0  ?Anxiety Difficulty Somewhat difficult   ? ? ?[place] [facility] ?PHQ-9 Total Score 6  ? ?  ? ? ? ?Patient states her toenail "won't get back right".  Denies any pain.  States the toenail looks ugly and not sure what to do about it.  ? ? ?Relevant past medical, surgical, family and social history reviewed and updated as indicated. Interim medical history since our last  visit reviewed. ?Allergies and medications reviewed and updated. ? ?Review of Systems  ?Skin:   ?     Toenail discoloration  ?Neurological:   ?     Brain fog  ?Psychiatric/Behavioral:  Positive for dysphoric mood. Negative for suicidal ideas. The patient is nervous/anxious.   ? ?Per HPI unless specifically indicated above ? ?   ?Objective:  ?  ?BP 121/80   Pulse 88   Temp 98.1 ?F (36.7 ?C) (Oral)   Wt 88 lb 3.2 oz (40 kg)   LMP 12/29/2007   SpO2 93%   BMI 15.61 kg/m?   ?Wt Readings from Last 3 Encounters:  ?07/14/21 88 lb 3.2 oz (40 kg)  ?12/30/20 86 lb 12.8 oz (39.4 kg)  ?11/01/20 84 lb 3.2 oz (38.2 kg)  ?  ?Physical Exam ?Vitals and nursing note reviewed.  ?Constitutional:   ?   General: She is not in acute distress. ?   Appearance: Normal appearance. She is not ill-appearing, toxic-appearing or diaphoretic.  ?HENT:  ?   Head: Normocephalic.  ?   Right Ear: External ear normal.  ?   Left Ear: External ear normal.  ?   Nose: Nose normal.  ?   Mouth/Throat:  ?  Mouth: Mucous membranes are moist.  ?   Pharynx: Oropharynx is clear.  ?Eyes:  ?   General:     ?   Right eye: No discharge.     ?   Left eye: No discharge.  ?   Extraocular Movements: Extraocular movements intact.  ?   Conjunctiva/sclera: Conjunctivae normal.  ?   Pupils: Pupils are equal, round, and reactive to light.  ?Cardiovascular:  ?   Rate and Rhythm: Normal rate and regular rhythm.  ?   Heart sounds: No murmur heard. ?Pulmonary:  ?   Effort: Pulmonary effort is normal. No respiratory distress.  ?   Breath sounds: Normal breath sounds. No wheezing or rales.  ?Musculoskeletal:  ?   Cervical back: Normal range of motion and neck supple.  ?     Feet: ? ?Skin: ?   General: Skin is warm and dry.  ?   Capillary Refill: Capillary refill takes less than 2 seconds.  ?Neurological:  ?   General: No focal deficit present.  ?   Mental Status: She is alert and oriented to person, place, and time. Mental status is at baseline.  ?Psychiatric:     ?   Mood and  Affect: Mood normal.     ?   Behavior: Behavior normal.     ?   Thought Content: Thought content normal.     ?   Judgment: Judgment normal.  ? ? ?Results for orders placed or performed in visit on 12/30/20  ?CBC with Differential/Platelet  ?Result Value Ref Range  ? WBC 9.8 3.4 - 10.8 x10E3/uL  ? RBC 4.86 3.77 - 5.28 x10E6/uL  ? Hemoglobin 13.7 11.1 - 15.9 g/dL  ? Hematocrit 43.1 34.0 - 46.6 %  ? MCV 89 79 - 97 fL  ? MCH 28.2 26.6 - 33.0 pg  ? MCHC 31.8 31.5 - 35.7 g/dL  ? RDW 13.4 11.7 - 15.4 %  ? Platelets 322 150 - 450 x10E3/uL  ? Neutrophils 62 Not Estab. %  ? Lymphs 29 Not Estab. %  ? Monocytes 6 Not Estab. %  ? Eos 3 Not Estab. %  ? Basos 0 Not Estab. %  ? Neutrophils Absolute 6.1 1.4 - 7.0 x10E3/uL  ? Lymphocytes Absolute 2.9 0.7 - 3.1 x10E3/uL  ? Monocytes Absolute 0.6 0.1 - 0.9 x10E3/uL  ? EOS (ABSOLUTE) 0.3 0.0 - 0.4 x10E3/uL  ? Basophils Absolute 0.0 0.0 - 0.2 x10E3/uL  ? Immature Granulocytes 0 Not Estab. %  ? Immature Grans (Abs) 0.0 0.0 - 0.1 x10E3/uL  ?Comprehensive metabolic panel  ?Result Value Ref Range  ? Glucose 92 70 - 99 mg/dL  ? BUN 7 6 - 24 mg/dL  ? Creatinine, Ser 0.60 0.57 - 1.00 mg/dL  ? eGFR 103 >59 mL/min/1.73  ? BUN/Creatinine Ratio 12 9 - 23  ? Sodium 142 134 - 144 mmol/L  ? Potassium 4.6 3.5 - 5.2 mmol/L  ? Chloride 100 96 - 106 mmol/L  ? CO2 27 20 - 29 mmol/L  ? Calcium 9.8 8.7 - 10.2 mg/dL  ? Total Protein 7.0 6.0 - 8.5 g/dL  ? Albumin 4.6 3.8 - 4.9 g/dL  ? Globulin, Total 2.4 1.5 - 4.5 g/dL  ? Albumin/Globulin Ratio 1.9 1.2 - 2.2  ? Bilirubin Total 0.3 0.0 - 1.2 mg/dL  ? Alkaline Phosphatase 52 44 - 121 IU/L  ? AST 47 (H) 0 - 40 IU/L  ? ALT 47 (H) 0 - 32 IU/L  ?Lipid Panel w/o Chol/HDL  Ratio  ?Result Value Ref Range  ? Cholesterol, Total 211 (H) 100 - 199 mg/dL  ? Triglycerides 74 0 - 149 mg/dL  ? HDL 105 >39 mg/dL  ? VLDL Cholesterol Cal 13 5 - 40 mg/dL  ? LDL Chol Calc (NIH) 93 0 - 99 mg/dL  ?HIV Antibody (routine testing w rflx)  ?Result Value Ref Range  ? HIV Screen 4th  Generation wRfx Non Reactive Non Reactive  ?Hepatitis C Antibody  ?Result Value Ref Range  ? Hep C Virus Ab <0.1 0.0 - 0.9 s/co ratio  ?Cytology - PAP  ?Result Value Ref Range  ? High risk HPV Negative   ? Adequacy    ?  Satisfactory for evaluation. The presence or absence of an  ? Adequacy    ?  endocervical/transformation zone component cannot be determined because  ? Adequacy of atrophy.   ? Diagnosis    ?  - Negative for intraepithelial lesion or malignancy (NILM)  ? Comment Normal Reference Range HPV - Negative   ? ?   ?Assessment & Plan:  ? ?Problem List Items Addressed This Visit   ? ?  ? Other  ? Hypercholesteremia - Primary  ?  Chronic.  Patient having side effects from Crestor 1m and has been cutting it in half and tolerating it will.  Will change dose to 167m  Follow up in 6 months.  Call sooner if concerns arise. ? ?  ?  ? Relevant Medications  ? rosuvastatin (CRESTOR) 10 MG tablet  ? Other Relevant Orders  ? Comp Met (CMET)  ? Lipid Profile  ? Mixed anxiety and depressive disorder  ?  Chronic. Improved will Abilify but feels like it could be better.  Will increase dose to 32m432mContinue with Lexapro.  Follow up in 6 months for reevaluation. ? ?  ?  ? Relevant Medications  ? escitalopram (LEXAPRO) 20 MG tablet  ? Other Relevant Orders  ? Comp Met (CMET)  ? Vitamin D deficiency  ?  Labs ordered at visit today. Will make recommendations based on lab results. ? ?  ?  ? Relevant Orders  ? Comp Met (CMET)  ? Vitamin D (25 hydroxy)  ? ?Other Visit Diagnoses   ? ? Tinea pedis of left foot      ? Referral placed for patient to see podiatry.  ? Relevant Orders  ? Ambulatory referral to Podiatry  ? ?  ?  ? ?Follow up plan: ?Return in about 6 months (around 01/14/2022) for Physical and Fasting labs. ? ? ? ? ? ?

## 2021-07-14 ENCOUNTER — Encounter: Payer: Self-pay | Admitting: Nurse Practitioner

## 2021-07-14 ENCOUNTER — Ambulatory Visit: Payer: BC Managed Care – PPO | Admitting: Nurse Practitioner

## 2021-07-14 VITALS — BP 121/80 | HR 88 | Temp 98.1°F | Wt 88.2 lb

## 2021-07-14 DIAGNOSIS — E559 Vitamin D deficiency, unspecified: Secondary | ICD-10-CM | POA: Diagnosis not present

## 2021-07-14 DIAGNOSIS — E7800 Pure hypercholesterolemia, unspecified: Secondary | ICD-10-CM

## 2021-07-14 DIAGNOSIS — B353 Tinea pedis: Secondary | ICD-10-CM | POA: Diagnosis not present

## 2021-07-14 DIAGNOSIS — F418 Other specified anxiety disorders: Secondary | ICD-10-CM

## 2021-07-14 MED ORDER — ROSUVASTATIN CALCIUM 10 MG PO TABS: 10.0000 mg | ORAL_TABLET | Freq: Every day | ORAL | 1 refills | Status: DC

## 2021-07-14 MED ORDER — ARIPIPRAZOLE 5 MG PO TABS: 5.0000 mg | ORAL_TABLET | Freq: Every day | ORAL | 1 refills | Status: DC

## 2021-07-14 MED ORDER — ESCITALOPRAM OXALATE 20 MG PO TABS: 20.0000 mg | ORAL_TABLET | Freq: Every day | ORAL | 1 refills | Status: DC

## 2021-07-14 NOTE — Assessment & Plan Note (Signed)
Chronic.  Patient having side effects from Crestor 20mg  and has been cutting it in half and tolerating it will.  Will change dose to 10mg .  Follow up in 6 months.  Call sooner if concerns arise. ?

## 2021-07-14 NOTE — Assessment & Plan Note (Signed)
Chronic. Improved will Abilify but feels like it could be better.  Will increase dose to 5mg . Continue with Lexapro.  Follow up in 6 months for reevaluation. ?

## 2021-07-14 NOTE — Assessment & Plan Note (Signed)
Labs ordered at visit today.  Will make recommendations based on lab results.   

## 2021-07-15 LAB — COMPREHENSIVE METABOLIC PANEL
ALT: 25 IU/L (ref 0–32)
AST: 39 IU/L (ref 0–40)
Albumin/Globulin Ratio: 2.1 (ref 1.2–2.2)
Albumin: 4.6 g/dL (ref 3.8–4.9)
Alkaline Phosphatase: 51 IU/L (ref 44–121)
BUN/Creatinine Ratio: 17 (ref 12–28)
BUN: 11 mg/dL (ref 8–27)
Bilirubin Total: <0.2 mg/dL (ref 0.0–1.2)
CO2: 25 mmol/L (ref 20–29)
Calcium: 9.5 mg/dL (ref 8.7–10.3)
Chloride: 98 mmol/L (ref 96–106)
Creatinine, Ser: 0.64 mg/dL (ref 0.57–1.00)
Globulin, Total: 2.2 g/dL (ref 1.5–4.5)
Glucose: 80 mg/dL (ref 70–99)
Potassium: 4.4 mmol/L (ref 3.5–5.2)
Sodium: 140 mmol/L (ref 134–144)
Total Protein: 6.8 g/dL (ref 6.0–8.5)
eGFR: 101 mL/min/{1.73_m2} (ref >59)

## 2021-07-15 LAB — LIPID PANEL
Chol/HDL Ratio: 2 ratio (ref 0.0–4.4)
Cholesterol, Total: 197 mg/dL (ref 100–199)
HDL: 101 mg/dL (ref >39)
LDL Chol Calc (NIH): 87 mg/dL (ref 0–99)
Triglycerides: 47 mg/dL (ref 0–149)
VLDL Cholesterol Cal: 9 mg/dL (ref 5–40)

## 2021-07-15 LAB — VITAMIN D 25 HYDROXY (VIT D DEFICIENCY, FRACTURES): Vit D, 25-Hydroxy: 46.7 ng/mL (ref 30.0–100.0)

## 2021-07-17 NOTE — Progress Notes (Signed)
Please let patient know her lab work looks great.  No concerns at this time.  Her vitamin D is within normal limits. Liver, kidneys and electrolytes look good.  Continue with current medication regimen.  Follow up as discussed.

## 2022-09-05 ENCOUNTER — Ambulatory Visit: Payer: Self-pay | Admitting: *Deleted

## 2022-09-05 NOTE — Telephone Encounter (Signed)
  Chief Complaint: front of neck is tender and throat is sore Symptoms: Woke up this morning with a sore throat and tenderness in front of her neck that has gotten much better as the day has gone by.  It is tender when she coughs, clears her throat or swallows. Frequency: This morning Pertinent Negatives: Patient denies difficulty swallowing or breathing, no accidents, injuries, recent illnesses, no fever, runny nose, chills.    Disposition: [] ED /[] Urgent Care (no appt availability in office) / [] Appointment(In office/virtual)/ []  [facility]/ [x] Home Care/ [] Refused Recommended Disposition /[] [facility]/ []  Follow-up with PCP Additional Notes: Advised her to call us back if she developed fever, difficulty swallowing or the soreness got worse.   Advised to gargle with warm salt water 3-4 times a day.   If she feels like her throat is swelling or closing to go to the ED.   She was agreeable to this plan.

## 2022-09-05 NOTE — Telephone Encounter (Signed)
Message from Kandis Cocking sent at 09/05/2022 11:22 AM EDT  Summary: Sore throat, neck soreness   The patient called in stating she woke up with a sore throat. She says it hurts when she swallows and she states her neck is sore as well. Please assist patient further          Call History   Type Contact Phone/Fax User  09/05/2022 11:19 AM EDT Phone (Incoming) Natalie Lawson, Natalie Lawson (Self) [phone] Rexene Edison) Kandis Cocking   Reason for Disposition  [1] Sore throat is the only symptom AND [2] sore throat present < 48 hours  Answer Assessment - Initial Assessment Questions 1. ONSET: "When did the throat start hurting?" (Hours or days ago)      I woke up with a sore throat   It hurts when I swallow, clear my throat or cough.   No fever.   No runny nose.   No post nasal drainage. My neck is tender.  I woke up this morning with it.  It's in the front of my neck.  No lumps or bumps.   2. SEVERITY: "How bad is the sore throat?" (Scale 1-10; mild, moderate or severe)   - MILD (1-3):  Doesn't interfere with eating or normal activities.   - MODERATE (4-7): Interferes with eating some solids and normal activities.   - SEVERE (8-10):  Excruciating pain, interferes with most normal activities.   - SEVERE WITH DYSPHAGIA (10): Can't swallow liquids, drooling.     Mild 3. STREP EXPOSURE: "Has there been any exposure to strep within the past week?" If Yes, ask: "What type of contact occurred?"      No 4.  VIRAL SYMPTOMS: "Are there any symptoms of a cold, such as a runny nose, cough, hoarse voice or red eyes?"      No  5. FEVER: "Do you have a fever?" If Yes, ask: "What is your temperature, how was it measured, and when did it start?"     No  6. PUS ON THE TONSILS: "Is there pus on the tonsils in the back of your throat?"     I didn't see any 7. OTHER SYMPTOMS: "Do you have any other symptoms?" (e.g., difficulty breathing, headache, rash)     No vomiting, diarrhea or headaches.   No bruising or redness.    8.  PREGNANCY: "Is there any chance you are pregnant?" "When was your last menstrual period?"     N/A due to age  Protocols used: Sore Throat-A-AH

## 2023-01-22 DIAGNOSIS — J441 Chronic obstructive pulmonary disease with (acute) exacerbation: Secondary | ICD-10-CM | POA: Diagnosis not present

## 2023-01-22 DIAGNOSIS — R059 Cough, unspecified: Secondary | ICD-10-CM | POA: Diagnosis not present

## 2023-01-22 DIAGNOSIS — R062 Wheezing: Secondary | ICD-10-CM | POA: Diagnosis not present

## 2023-03-07 ENCOUNTER — Ambulatory Visit: Payer: BC Managed Care – PPO | Admitting: Nurse Practitioner

## 2023-03-07 ENCOUNTER — Encounter: Payer: Self-pay | Admitting: Nurse Practitioner

## 2023-03-07 VITALS — BP 101/62 | HR 81 | Temp 98.5°F | Ht 63.0 in | Wt 86.0 lb

## 2023-03-07 DIAGNOSIS — Z1211 Encounter for screening for malignant neoplasm of colon: Secondary | ICD-10-CM

## 2023-03-07 DIAGNOSIS — E7800 Pure hypercholesterolemia, unspecified: Secondary | ICD-10-CM

## 2023-03-07 DIAGNOSIS — F17210 Nicotine dependence, cigarettes, uncomplicated: Secondary | ICD-10-CM

## 2023-03-07 DIAGNOSIS — F418 Other specified anxiety disorders: Secondary | ICD-10-CM | POA: Diagnosis not present

## 2023-03-07 DIAGNOSIS — E559 Vitamin D deficiency, unspecified: Secondary | ICD-10-CM

## 2023-03-07 DIAGNOSIS — Z1231 Encounter for screening mammogram for malignant neoplasm of breast: Secondary | ICD-10-CM

## 2023-03-07 DIAGNOSIS — Z0000 Encounter for general adult medical examination without abnormal findings: Secondary | ICD-10-CM | POA: Diagnosis not present

## 2023-03-07 DIAGNOSIS — Z23 Encounter for immunization: Secondary | ICD-10-CM | POA: Diagnosis not present

## 2023-03-07 DIAGNOSIS — J449 Chronic obstructive pulmonary disease, unspecified: Secondary | ICD-10-CM | POA: Diagnosis not present

## 2023-03-07 DIAGNOSIS — F17200 Nicotine dependence, unspecified, uncomplicated: Secondary | ICD-10-CM

## 2023-03-07 LAB — MICROSCOPIC EXAMINATION: Bacteria, UA: NONE SEEN

## 2023-03-07 LAB — URINALYSIS, ROUTINE W REFLEX MICROSCOPIC
Bilirubin, UA: NEGATIVE
Glucose, UA: NEGATIVE
Ketones, UA: NEGATIVE
Nitrite, UA: NEGATIVE
Protein,UA: NEGATIVE
Specific Gravity, UA: 1.015 (ref 1.005–1.030)
Urobilinogen, Ur: 1 mg/dL (ref 0.2–1.0)
pH, UA: 6.5 (ref 5.0–7.5)

## 2023-03-07 MED ORDER — ROSUVASTATIN CALCIUM 20 MG PO TABS: 20.0000 mg | ORAL_TABLET | Freq: Every day | ORAL | 1 refills | Status: DC

## 2023-03-07 MED ORDER — ESCITALOPRAM OXALATE 20 MG PO TABS: 20.0000 mg | ORAL_TABLET | Freq: Every day | ORAL | 1 refills | Status: DC

## 2023-03-07 MED ORDER — BUDESONIDE-FORMOTEROL FUMARATE 160-4.5 MCG/ACT IN AERO: 2.0000 | INHALATION_SPRAY | Freq: Two times a day (BID) | RESPIRATORY_TRACT | 3 refills | Status: DC

## 2023-03-07 NOTE — Assessment & Plan Note (Signed)
 Chronic.  Will increase rosuvastatin to 20mg  for prevention purposes.  Labs ordered today.  Follow up in 6 months.  Call sooner if concerns arise.

## 2023-03-07 NOTE — Assessment & Plan Note (Signed)
 Chronic.  Will start Symbicort.  Discussed how to properly use medication.  Discussed how to properly use Albuterol PRN.  Follow up in 6 months.  CT Lung ordered.

## 2023-03-07 NOTE — Assessment & Plan Note (Signed)
 Discussed cessation and available treatment including nicotine replacement options, pharmacologic treatment, and/or online resources. Based on our discussion, she does not want to initiate treatment today. Plans to initiate tx w/  decreasing by 1 cigarette a month . Total time spent on discussion: 8 minutes.

## 2023-03-07 NOTE — Assessment & Plan Note (Signed)
Chronic.  Controlled.  Continue with current medication regimen of Lexapro '20mg'$  daily.  Refills sent today.  Labs ordered today.  Return to clinic in 6 months for reevaluation.  Call sooner if concerns arise.

## 2023-03-07 NOTE — Patient Instructions (Signed)
 Please call to schedule your mammogram and/or bone density: [facility] at [facility]  Address: [address], [place], [place] [address] Phone: [phone]  [facility] at [facility] [address] [place],  [place]  [address] Phone: [phone]

## 2023-03-07 NOTE — Progress Notes (Signed)
 BP 101/62 (BP Location: Left Arm, Patient Position: Sitting, Cuff Size: Normal)   Pulse 81   Temp 98.5 F (36.9 C) (Oral)   Ht 5' 3 (1.6 m)   Wt 86 lb (39 kg)   LMP 12/29/2007   SpO2 95%   BMI 15.23 kg/m    Subjective:    Patient ID: Natalie Lawson, female    DOB: 1961-01-28, 63 y.o.   MRN: [id number]  HPI: Natalie Lawson is a 63 y.o. female presenting on 03/07/2023 for comprehensive medical examination. Current medical complaints include: COPD  She currently lives with: Menopausal Symptoms: no  HYPERLIPIDEMIA Hyperlipidemia status: excellent compliance Satisfied with current treatment?  yes Side effects:  no Medication compliance: excellent compliance Past cholesterol meds: rosuvastatin  (crestor ) Supplements: none Aspirin:  no The ASCVD Risk score (Arnett DK, et al., 2019) failed to calculate for the following reasons:   The valid HDL cholesterol range is 20 to 100 mg/dL Chest pain:  no Coronary artery disease:  no Family history CAD:  no Family history early CAD:  no She feels like her brain is cloudy when she takes the full 20mg .  She has been cutting it in half and tolerating it well.   COPD COPD status: controlled Satisfied with current treatment?: yes Oxygen use: no Dyspnea frequency: with activity Cough frequency: some Rescue inhaler frequency: rarely   Limitation of activity: yes Productive cough: no Last Spirometry: none Pneumovax: Up to Date Influenza: Up to Date  SMOKING CESSATION Smoking Status: Current everyday smoker Smoking Amount: 7 cigarettes per day (down from 1ppd) Smoking Onset: teenage Smoking Quit Date: 8 months Smoking triggers: stress Type of tobacco use: cigarettes Other household members who smoke: no Treatments attempted: Lozenges and Nicoderm patch Pneumovax: up to date    Denies HA, CP, dizziness, palpitations, visual changes, and lower extremity swelling.    MOOD Patient states she feels like its a little better. She is on the  lexapro  and feels like it is working well for her.  She is not taking the abilify  and doesn't feel like she needs it.     Depression Screen done today and results listed below:     03/07/2023    3:00 PM 07/14/2021    8:51 AM 12/30/2020    8:25 AM 11/01/2020   11:07 AM 12/28/2016    9:07 AM  Depression screen PHQ 2/9  Decreased Interest 2 1 0 3 0  Down, Depressed, Hopeless 2 1 0 0 0  PHQ - 2 Score 4 2 0 3 0  Altered sleeping 2 0 0 3 0  Tired, decreased energy 3 1 0 3 1  Change in appetite 0 0 0 0 0  Feeling bad or failure about yourself  0 0 0 0 0  Trouble concentrating 1 3 0 3 0  Moving slowly or fidgety/restless 0 0 0 0 0  Suicidal thoughts 0 0 0 0 0  PHQ-9 Score 10 6 0 12 1  Difficult doing work/chores  Somewhat difficult Not difficult at all Somewhat difficult     The patient does not have a history of falls. I did complete a risk assessment for falls. A plan of care for falls was documented.   Past Medical History:  Past Medical History:  Diagnosis Date   Allergy    COPD (chronic obstructive pulmonary disease) (HCC)    Hyperlipidemia     Surgical History:  Past Surgical History:  Procedure Laterality Date   NO PAST SURGERIES  Medications:  Current Outpatient Medications on File Prior to Visit  Medication Sig   Multiple Vitamin (MULTIVITAMIN) tablet Take 1 tablet by mouth daily.   Vitamin D , Cholecalciferol, 25 MCG (1000 UT) CAPS 1 capsule (Patient not taking: Reported on 03/07/2023)   No current facility-administered medications on file prior to visit.    Allergies:  No Known Allergies  Social History:  Social History   Socioeconomic History   Marital status: Single    Spouse name: Not on file   Number of children: Not on file   Years of education: Not on file   Highest education level: Not on file  Occupational History   Not on file  Tobacco Use   Smoking status: Every Day    Current packs/day: 0.75    Types: Cigarettes   Smokeless tobacco:  Never  Vaping Use   Vaping status: Never Used  Substance and Sexual Activity   Alcohol use: No   Drug use: No   Sexual activity: Never  Other Topics Concern   Not on file  Social History Narrative   Not on file   Social Drivers of Health   Financial Resource Strain: Low Risk  (01/22/2023)   Received from [facility]   Overall Financial Resource Strain (CARDIA)    Difficulty of Paying Living Expenses: Not very hard  Food Insecurity: No Food Insecurity (01/22/2023)   Received from [facility]   Hunger Vital Sign    Worried About Running Out of Food in the Last Year: Never true    Ran Out of Food in the Last Year: Never true  Transportation Needs: No Transportation Needs (01/22/2023)   Received from [facility] - Transportation    In the past 12 months, has lack of transportation kept you from medical appointments or from getting medications?: No    Lack of Transportation (Non-Medical): No  Physical Activity: Not on file  Stress: Not on file  Social Connections: Unknown (07/18/2021)   Received from [facility]   Social Network    Social Network: Not on file  Intimate Partner Violence: Unknown (06/09/2021)   Received from [facility]   HITS    Physically Hurt: Not on file    Insult or Talk Down To: Not on file    Threaten Physical Harm: Not on file    Scream or Curse: Not on file   Social History   Tobacco Use  Smoking Status Every Day   Current packs/day: 0.75   Types: Cigarettes  Smokeless Tobacco Never   Social History   Substance and Sexual Activity  Alcohol Use No    Family History:  Family History  Problem Relation Age of Onset   Brain cancer Mother    Asthma Father    Breast cancer Sister    Healthy Sister    Cancer Brother     Past medical history, surgical history, medications, allergies, family history and social history reviewed with patient today and  changes made to appropriate areas of the chart.   Review of Systems  Eyes:  Negative for blurred vision and double vision.  Respiratory:  Positive for shortness of breath.   Cardiovascular:  Negative for chest pain, palpitations and leg swelling.  Neurological:  Negative for dizziness and headaches.  Psychiatric/Behavioral:  Positive for depression. Negative for suicidal ideas.    All other ROS negative except what is listed above and in the HPI.  Objective:    BP 101/62 (BP Location: Left Arm, Patient Position: Sitting, Cuff Size: Normal)   Pulse 81   Temp 98.5 F (36.9 C) (Oral)   Ht 5' 3 (1.6 m)   Wt 86 lb (39 kg)   LMP 12/29/2007   SpO2 95%   BMI 15.23 kg/m   Wt Readings from Last 3 Encounters:  03/07/23 86 lb (39 kg)  07/14/21 88 lb 3.2 oz (40 kg)  12/30/20 86 lb 12.8 oz (39.4 kg)    Physical Exam Vitals and nursing note reviewed.  Constitutional:      General: She is awake. She is not in acute distress.    Appearance: Normal appearance. She is well-developed. She is not ill-appearing.  HENT:     Head: Normocephalic and atraumatic.     Right Ear: Hearing, tympanic membrane, ear canal and external ear normal. No drainage.     Left Ear: Hearing, tympanic membrane, ear canal and external ear normal. No drainage.     Nose: Nose normal.     Right Sinus: No maxillary sinus tenderness or frontal sinus tenderness.     Left Sinus: No maxillary sinus tenderness or frontal sinus tenderness.     Mouth/Throat:     Mouth: Mucous membranes are moist.     Pharynx: Oropharynx is clear. Uvula midline. No pharyngeal swelling, oropharyngeal exudate or posterior oropharyngeal erythema.  Eyes:     General: Lids are normal.        Right eye: No discharge.        Left eye: No discharge.     Extraocular Movements: Extraocular movements intact.     Conjunctiva/sclera: Conjunctivae normal.     Pupils: Pupils are equal, round, and reactive to light.     Visual Fields: Right eye  visual fields normal and left eye visual fields normal.  Neck:     Thyroid : No thyromegaly.     Vascular: No carotid bruit.     Trachea: Trachea normal.  Cardiovascular:     Rate and Rhythm: Normal rate and regular rhythm.     Heart sounds: Normal heart sounds. No murmur heard.    No gallop.  Pulmonary:     Effort: Pulmonary effort is normal. No accessory muscle usage or respiratory distress.     Breath sounds: Normal breath sounds.  Chest:  Breasts:    Right: Normal.     Left: Normal.  Abdominal:     General: Bowel sounds are normal.     Palpations: Abdomen is soft. There is no hepatomegaly or splenomegaly.     Tenderness: There is no abdominal tenderness.  Musculoskeletal:        General: Normal range of motion.     Cervical back: Normal range of motion and neck supple.     Right lower leg: No edema.     Left lower leg: No edema.  Lymphadenopathy:     Head:     Right side of head: No submental, submandibular, tonsillar, preauricular or posterior auricular adenopathy.     Left side of head: No submental, submandibular, tonsillar, preauricular or posterior auricular adenopathy.     Cervical: No cervical adenopathy.     Upper Body:     Right upper body: No supraclavicular, axillary or pectoral adenopathy.     Left upper body: No supraclavicular, axillary or pectoral adenopathy.  Skin:    General: Skin is warm and dry.     Capillary Refill: Capillary refill takes less than 2 seconds.  Findings: No rash.  Neurological:     Mental Status: She is alert and oriented to person, place, and time.     Gait: Gait is intact.  Psychiatric:        Attention and Perception: Attention normal.        Mood and Affect: Mood normal.        Speech: Speech normal.        Behavior: Behavior normal. Behavior is cooperative.        Thought Content: Thought content normal.        Judgment: Judgment normal.     Results for orders placed or performed in visit on 07/14/21  Comp Met (CMET)    Collection Time: 07/14/21  8:59 AM  Result Value Ref Range   Glucose 80 70 - 99 mg/dL   BUN 11 8 - 27 mg/dL   Creatinine, Ser 9.35 0.57 - 1.00 mg/dL   eGFR 898 >40 fO/fpw/8.26   BUN/Creatinine Ratio 17 12 - 28   Sodium 140 134 - 144 mmol/L   Potassium 4.4 3.5 - 5.2 mmol/L   Chloride 98 96 - 106 mmol/L   CO2 25 20 - 29 mmol/L   Calcium  9.5 8.7 - 10.3 mg/dL   Total Protein 6.8 6.0 - 8.5 g/dL   Albumin 4.6 3.8 - 4.9 g/dL   Globulin, Total 2.2 1.5 - 4.5 g/dL   Albumin/Globulin Ratio 2.1 1.2 - 2.2   Bilirubin Total <0.2 0.0 - 1.2 mg/dL   Alkaline Phosphatase 51 44 - 121 IU/L   AST 39 0 - 40 IU/L   ALT 25 0 - 32 IU/L  Lipid Profile   Collection Time: 07/14/21  8:59 AM  Result Value Ref Range   Cholesterol, Total 197 100 - 199 mg/dL   Triglycerides 47 0 - 149 mg/dL   HDL 898 >60 mg/dL   VLDL Cholesterol Cal 9 5 - 40 mg/dL   LDL Chol Calc (NIH) 87 0 - 99 mg/dL   Chol/HDL Ratio 2.0 0.0 - 4.4 ratio  Vitamin D  (25 hydroxy)   Collection Time: 07/14/21  8:59 AM  Result Value Ref Range   Vit D, 25-Hydroxy 46.7 30.0 - 100.0 ng/mL      Assessment & Plan:   Problem List Items Addressed This Visit       Respiratory   COPD (chronic obstructive pulmonary disease) (HCC)   Chronic.  Will start Symbicort .  Discussed how to properly use medication.  Discussed how to properly use Albuterol PRN.  Follow up in 6 months.  CT Lung ordered.       Relevant Medications   budesonide -formoterol  (SYMBICORT ) 160-4.5 MCG/ACT inhaler   Other Relevant Orders   Ambulatory Referral Lung Cancer Screening [facility]     Other   Smoker   Discussed cessation and available treatment including nicotine replacement options, pharmacologic treatment, and/or online resources. Based on our discussion, she does not want to initiate treatment today. Plans to initiate tx w/  decreasing by 1 cigarette a month . Total time spent on discussion: 8 minutes.          Hypercholesteremia   Chronic.  Will increase  rosuvastatin  to 20mg  for prevention purposes.  Labs ordered today.  Follow up in 6 months.  Call sooner if concerns arise.       Relevant Medications   rosuvastatin  (CRESTOR ) 20 MG tablet   Other Relevant Orders   Lipid panel   Mixed anxiety and depressive disorder   Chronic.  Controlled.  Continue with current medication regimen of Lexapro  20mg  daily.  Refills sent today.  Labs ordered today.  Return to clinic in 6 months for reevaluation.  Call sooner if concerns arise.        Relevant Medications   escitalopram  (LEXAPRO ) 20 MG tablet   Vitamin D  deficiency   Labs ordered at visit today.  Will make recommendations based on lab results.        Relevant Orders   Vitamin D  (25 hydroxy)   Other Visit Diagnoses       Annual physical exam    -  Primary   Relevant Orders   CBC with Differential/Platelet   Comprehensive metabolic panel   Lipid panel   TSH   Urinalysis, Routine w reflex microscopic     Encounter for screening mammogram for malignant neoplasm of breast       Relevant Orders   MM 3D SCREENING MAMMOGRAM BILATERAL BREAST     Screening for colon cancer       Relevant Orders   Ambulatory referral to Gastroenterology     Need for influenza vaccination       Relevant Orders   Flu vaccine trivalent PF, 6mos and older(Flulaval,Afluria,Fluarix,Fluzone) (Completed)        Follow up plan: No follow-ups on file.   LABORATORY TESTING:  - Pap smear: up to date  IMMUNIZATIONS:   - Tdap: Tetanus vaccination status reviewed: last tetanus booster within 10 years. - Influenza: Administered today - Pneumovax: Up to date - Prevnar: Not applicable - COVID: Not applicable - HPV: Not applicable - Shingrix vaccine:  Will get at future visit  SCREENING: -Mammogram: Ordered today  - Colonoscopy: Ordered today  - Bone Density: Not applicable  -Hearing Test: Not applicable  -Spirometry: Not applicable   PATIENT COUNSELING:   Advised to take 1 mg of folate supplement per  day if capable of pregnancy.   Sexuality: Discussed sexually transmitted diseases, partner selection, use of condoms, avoidance of unintended pregnancy  and contraceptive alternatives.   Advised to avoid cigarette smoking.  I discussed with the patient that most people either abstain from alcohol or drink within safe limits (<=14/week and <=4 drinks/occasion for males, <=7/weeks and <= 3 drinks/occasion for females) and that the risk for alcohol disorders and other health effects rises proportionally with the number of drinks per week and how often a drinker exceeds daily limits.  Discussed cessation/primary prevention of drug use and availability of treatment for abuse.   Diet: Encouraged to adjust caloric intake to maintain  or achieve ideal body weight, to reduce intake of dietary saturated fat and total fat, to limit sodium intake by avoiding high sodium foods and not adding table salt, and to maintain adequate dietary potassium and calcium  preferably from fresh fruits, vegetables, and low-fat dairy products.    stressed the importance of regular exercise  Injury prevention: Discussed safety belts, safety helmets, smoke detector, smoking near bedding or upholstery.   Dental health: Discussed importance of regular tooth brushing, flossing, and dental visits.    NEXT PREVENTATIVE PHYSICAL DUE IN 1 YEAR. No follow-ups on file.

## 2023-03-07 NOTE — Assessment & Plan Note (Signed)
 Labs ordered at visit today.  Will make recommendations based on lab results.

## 2023-03-08 ENCOUNTER — Telehealth: Payer: Self-pay

## 2023-03-08 ENCOUNTER — Telehealth: Payer: Self-pay | Admitting: Nurse Practitioner

## 2023-03-08 LAB — COMPREHENSIVE METABOLIC PANEL
ALT: 23 [IU]/L (ref 0–32)
AST: 32 [IU]/L (ref 0–40)
Albumin: 4.3 g/dL (ref 3.9–4.9)
Alkaline Phosphatase: 59 [IU]/L (ref 44–121)
BUN/Creatinine Ratio: 13 (ref 12–28)
BUN: 8 mg/dL (ref 8–27)
Bilirubin Total: 0.2 mg/dL (ref 0.0–1.2)
CO2: 27 mmol/L (ref 20–29)
Calcium: 9.6 mg/dL (ref 8.7–10.3)
Chloride: 96 mmol/L (ref 96–106)
Creatinine, Ser: 0.64 mg/dL (ref 0.57–1.00)
Globulin, Total: 2.1 g/dL (ref 1.5–4.5)
Glucose: 74 mg/dL (ref 70–99)
Potassium: 3.8 mmol/L (ref 3.5–5.2)
Sodium: 149 mmol/L — ABNORMAL HIGH (ref 134–144)
Total Protein: 6.4 g/dL (ref 6.0–8.5)
eGFR: 100 mL/min/{1.73_m2} (ref >59)

## 2023-03-08 LAB — CBC WITH DIFFERENTIAL/PLATELET
Basophils Absolute: 0 10*3/uL (ref 0.0–0.2)
Basos: 0 %
EOS (ABSOLUTE): 0.1 10*3/uL (ref 0.0–0.4)
Eos: 1 %
Hematocrit: 40.8 % (ref 34.0–46.6)
Hemoglobin: 13.4 g/dL (ref 11.1–15.9)
Immature Grans (Abs): 0 10*3/uL (ref 0.0–0.1)
Immature Granulocytes: 0 %
Lymphocytes Absolute: 2.5 10*3/uL (ref 0.7–3.1)
Lymphs: 35 %
MCH: 28.7 pg (ref 26.6–33.0)
MCHC: 32.8 g/dL (ref 31.5–35.7)
MCV: 87 fL (ref 79–97)
Monocytes Absolute: 0.7 10*3/uL (ref 0.1–0.9)
Monocytes: 10 %
Neutrophils Absolute: 3.8 10*3/uL (ref 1.4–7.0)
Neutrophils: 54 %
Platelets: 259 10*3/uL (ref 150–450)
RBC: 4.67 x10E6/uL (ref 3.77–5.28)
RDW: 13.6 % (ref 11.7–15.4)
WBC: 7.1 10*3/uL (ref 3.4–10.8)

## 2023-03-08 LAB — LIPID PANEL
Chol/HDL Ratio: 2.2 {ratio} (ref 0.0–4.4)
Cholesterol, Total: 214 mg/dL — ABNORMAL HIGH (ref 100–199)
HDL: 99 mg/dL (ref >39)
LDL Chol Calc (NIH): 104 mg/dL — ABNORMAL HIGH (ref 0–99)
Triglycerides: 60 mg/dL (ref 0–149)
VLDL Cholesterol Cal: 11 mg/dL (ref 5–40)

## 2023-03-08 LAB — TSH: TSH: 3.86 u[IU]/mL (ref 0.450–4.500)

## 2023-03-08 LAB — VITAMIN D 25 HYDROXY (VIT D DEFICIENCY, FRACTURES): Vit D, 25-Hydroxy: 32.1 ng/mL (ref 30.0–100.0)

## 2023-03-08 NOTE — Telephone Encounter (Signed)
 Copied from CRM [id number]. Topic: Referral - Question >> Mar 08, 2023  2:02 PM Edsel HERO wrote: Patient would like to talk to speak with someone about the cost of a colonoscopy. Please advise. Inform patient to [employer]. To discuss charges and/or copays.

## 2023-03-08 NOTE — Telephone Encounter (Signed)
 Gastroenterology Pre-Procedure Review  Request Date: TBD Requesting Physician: Dr. NELLIE  PATIENT REVIEW QUESTIONS: The patient responded to the following health history questions as indicated:    1. Are you having any GI issues? no 2. Do you have a personal history of Polyps? no 3. Do you have a family history of Colon Cancer or Polyps? no 4. Diabetes Mellitus? no 5. Joint replacements in the past 12 months?no 6. Major health problems in the past 3 months?no 7. Any artificial heart valves, MVP, or defibrillator?no    MEDICATIONS & ALLERGIES:    Patient reports the following regarding taking any anticoagulation/antiplatelet therapy:   Plavix, Coumadin, Eliquis, Xarelto, Lovenox, Pradaxa, Brilinta, or Effient? no Aspirin? no  Patient confirms/reports the following medications:  Current Outpatient Medications  Medication Sig Dispense Refill   budesonide -formoterol  (SYMBICORT ) 160-4.5 MCG/ACT inhaler Inhale 2 puffs into the lungs 2 (two) times daily. 1 each 3   escitalopram  (LEXAPRO ) 20 MG tablet Take 1 tablet (20 mg total) by mouth daily. 90 tablet 1   Multiple Vitamin (MULTIVITAMIN) tablet Take 1 tablet by mouth daily.     rosuvastatin  (CRESTOR ) 20 MG tablet Take 1 tablet (20 mg total) by mouth daily. 90 tablet 1   Vitamin D , Cholecalciferol, 25 MCG (1000 UT) CAPS 1 capsule (Patient not taking: Reported on 03/07/2023)     No current facility-administered medications for this visit.    Patient confirms/reports the following allergies:  No Known Allergies  No orders of the defined types were placed in this encounter.   AUTHORIZATION INFORMATION Primary Insurance: 1D#: Group #:  Secondary Insurance: 1D#: Group #:  SCHEDULE INFORMATION: Date: TBD Time: Location: TBD

## 2023-03-11 NOTE — Telephone Encounter (Signed)
 Attempted to reach patient, LVM regarding her concerns of the cost of a colonoscopy.  Advised on message to call the place as where she was referred to so they could give her a quote on the costs of a colonoscopy and then touch base with insurance company to see what is covered and not covered.

## 2023-04-07 IMAGING — MG MM DIGITAL DIAGNOSTIC UNILAT*L* W/ TOMO W/ CAD
6 series · 6 of 14 positions shown · non-contrast
Comparison: Previous exam(s).

CLINICAL DATA: Short term follow up left breast calcifications

EXAM:
DIGITAL DIAGNOSTIC UNILATERAL LEFT MAMMOGRAM WITH TOMOSYNTHESIS AND
CAD
TECHNIQUE: Left digital diagnostic mammography and breast tomosynthesis was
performed. The images were evaluated with computer-aided detection.

[L CC]
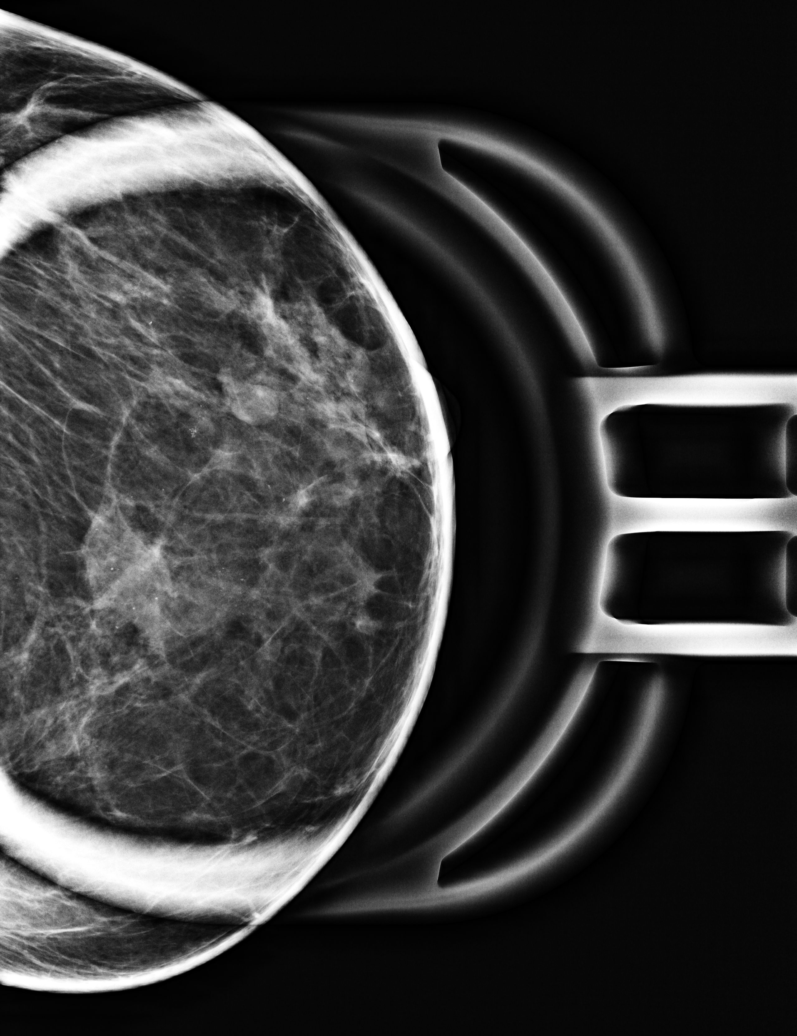

[L LM]
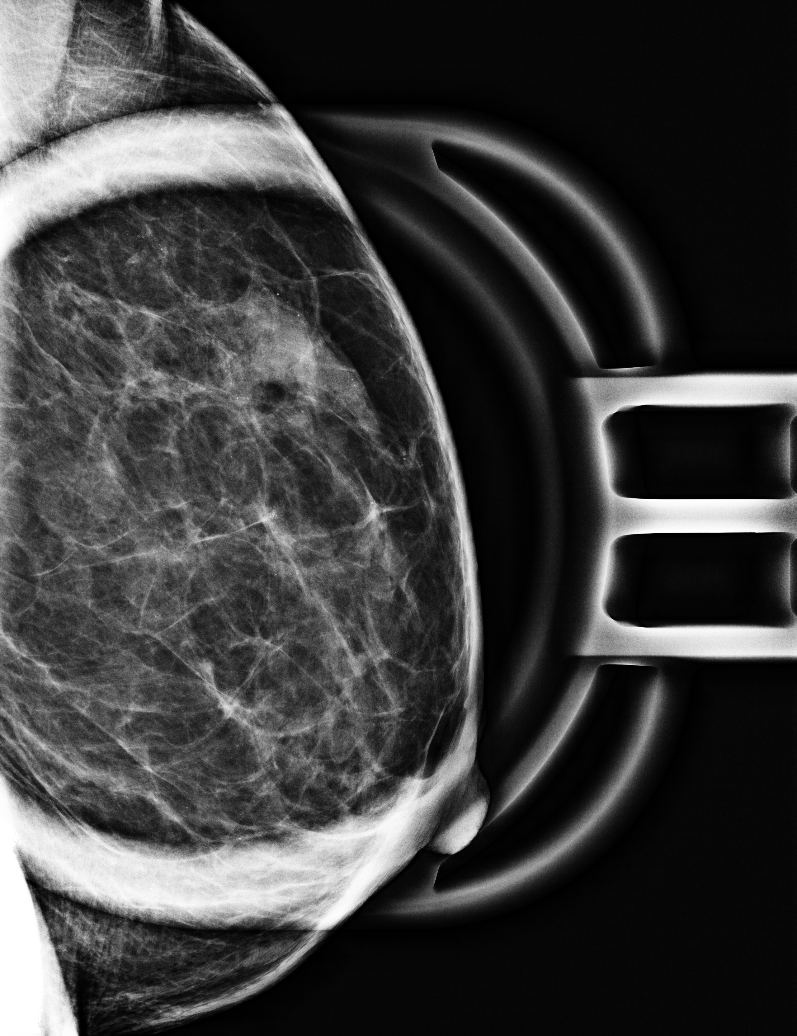

[L MLO synth-2D]
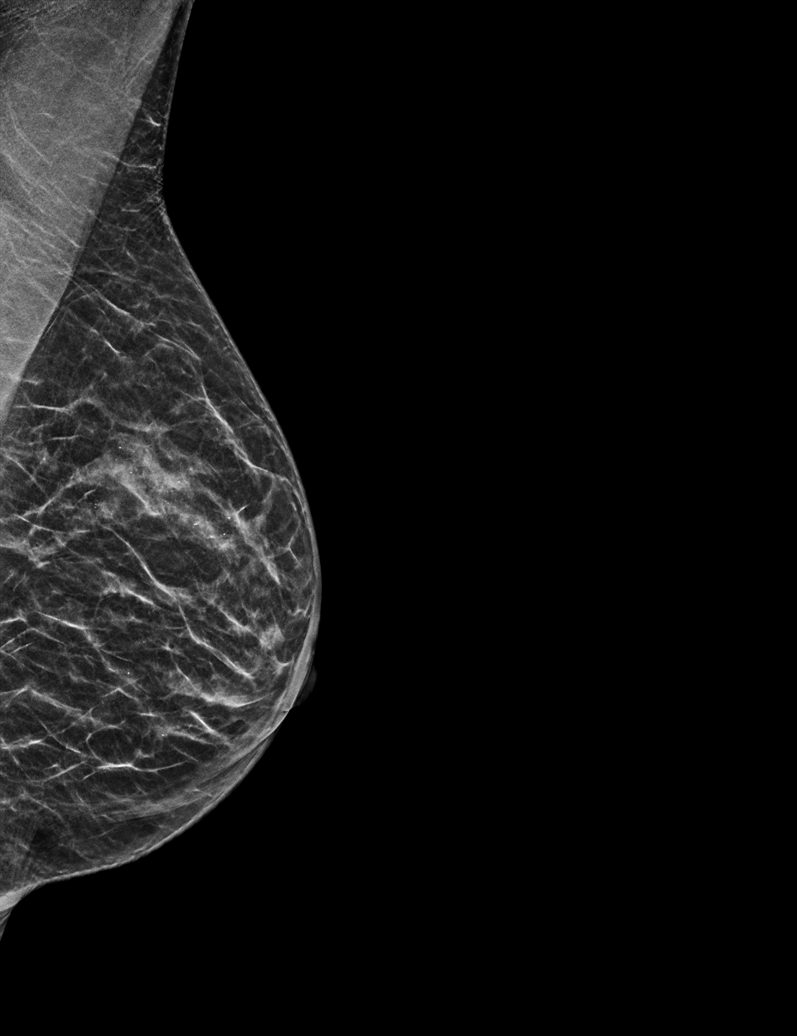

[L CC synth-2D]
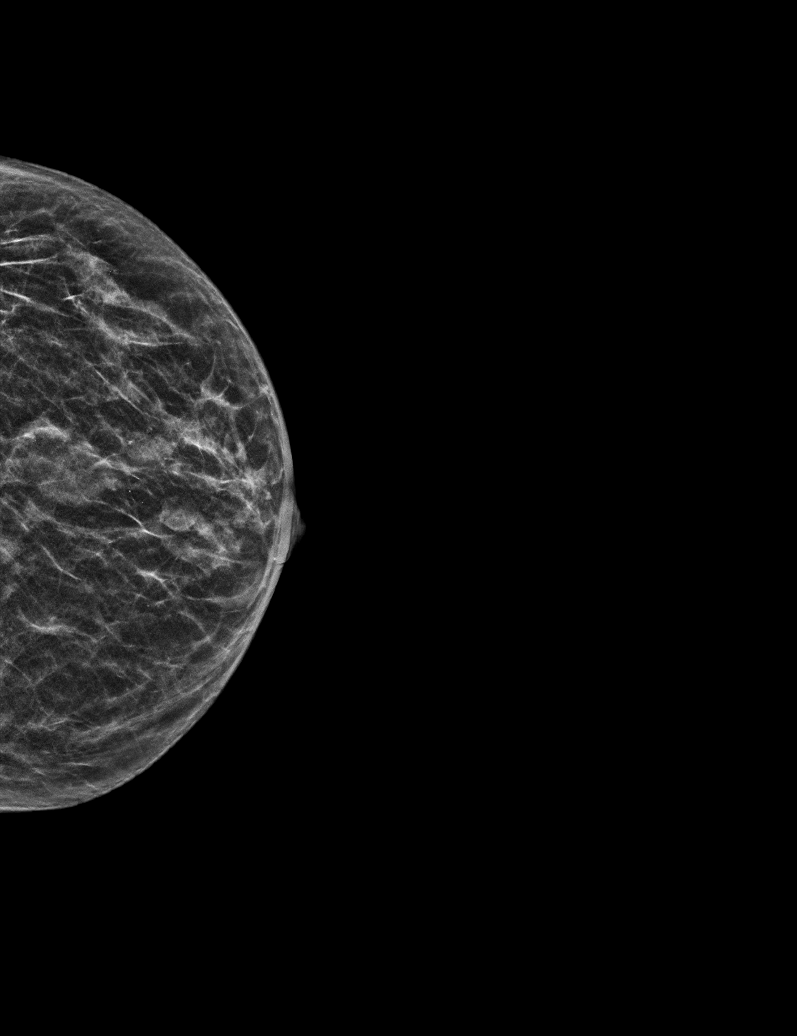

[L CC tomo · tomo slice 19/38.0]
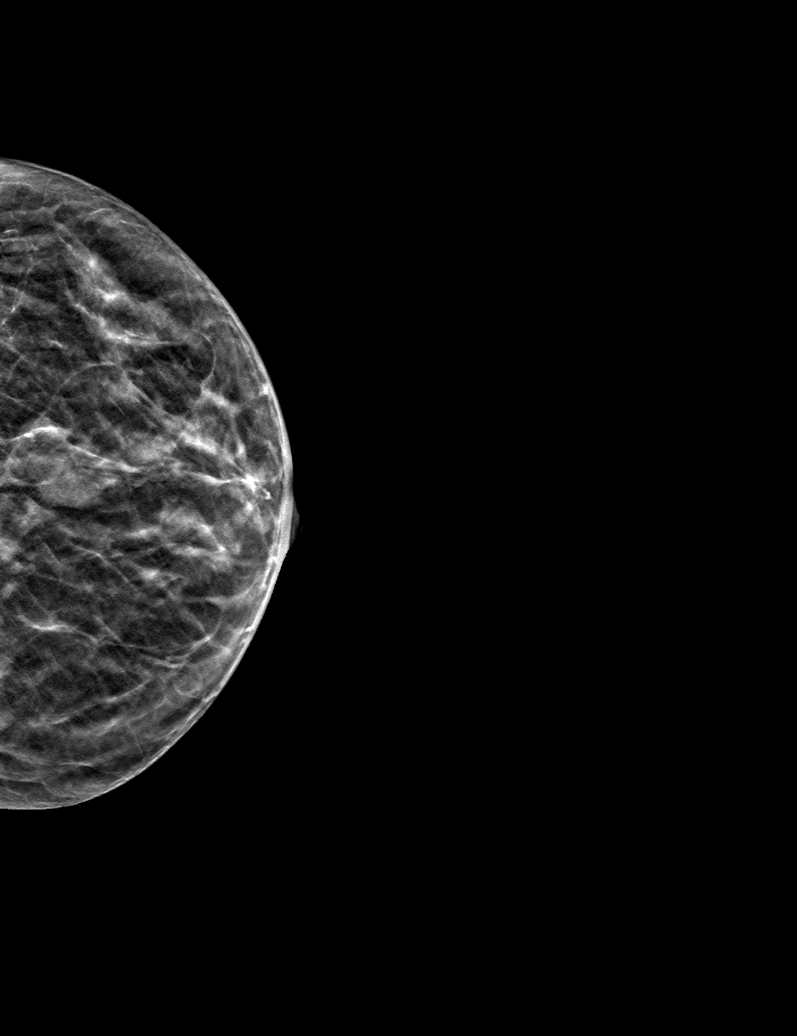

[L MLO tomo · tomo slice 20/39.0]
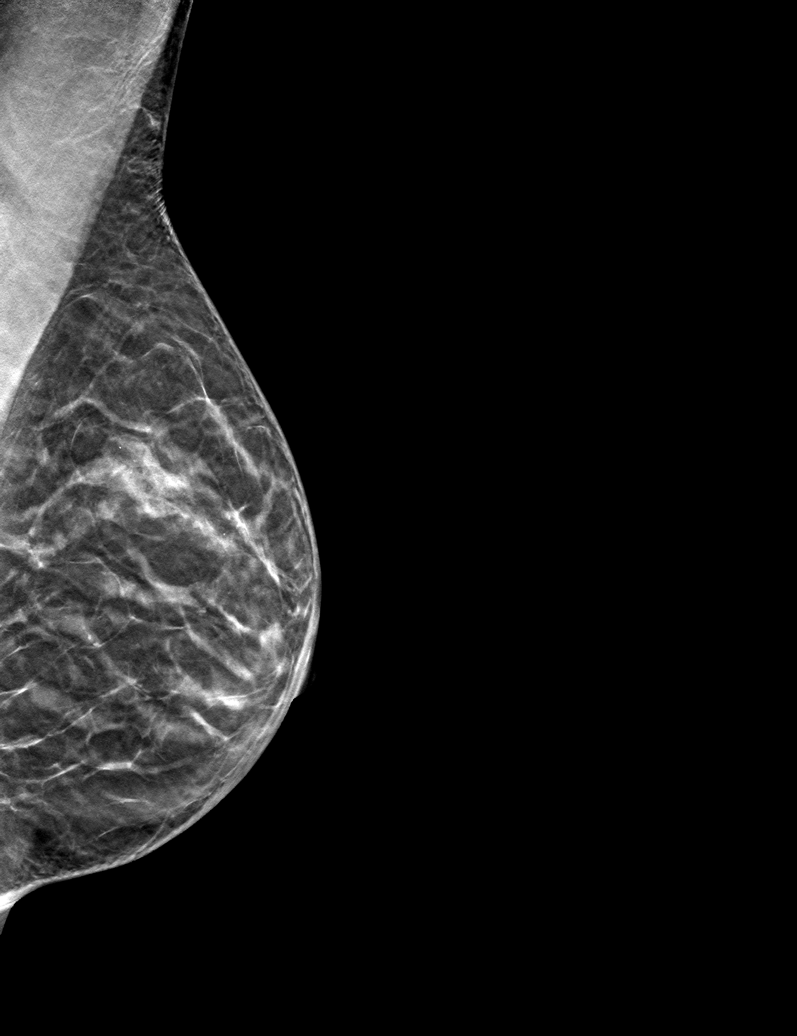

[6 of 14 positions shown; findings below may reference images not displayed]

ACR Breast Density Category b: There are scattered areas of
fibroglandular density.
FINDINGS: Cc and MLO views of the left breast, spot magnification cc and
lateral views of the left breast are submitted. Stable
calcifications are identified in the upper left breast. The breast
parenchyma is unchanged.
IMPRESSION: Probable benign findings.

RECOMMENDATION:
Bilateral diagnostic mammogram in Thursday September, 2021.

I have discussed the findings and recommendations with the patient.
If applicable, a reminder letter will be sent to the patient
regarding the next appointment.

BI-RADS CATEGORY  3: Probably benign.

## 2023-08-12 DIAGNOSIS — J019 Acute sinusitis, unspecified: Secondary | ICD-10-CM | POA: Diagnosis not present

## 2023-08-12 DIAGNOSIS — B9689 Other specified bacterial agents as the cause of diseases classified elsewhere: Secondary | ICD-10-CM | POA: Diagnosis not present

## 2023-08-12 DIAGNOSIS — J209 Acute bronchitis, unspecified: Secondary | ICD-10-CM | POA: Diagnosis not present

## 2023-09-09 ENCOUNTER — Ambulatory Visit: Payer: Self-pay | Admitting: Nurse Practitioner

## 2023-09-09 ENCOUNTER — Ambulatory Visit: Payer: Self-pay

## 2023-09-09 NOTE — Telephone Encounter (Signed)
 FYI Only or Action Required?: FYI only for provider.  Patient was last seen in primary care on 03/07/2023 by Melvin Pao, NP. Called Nurse Triage reporting Cough. Symptoms began several weeks ago. Interventions attempted: Prescription medications: Atb/Prednisone per UC 2 weeks ago and Rest, hydration, or home remedies. Symptoms are: unchanged.  Triage Disposition: See HCP Within 4 Hours (Or PCP Triage)  Patient/caregiver understands and will follow disposition?: No, wishes to speak with PCP d       Copied from CRM [id number]. Topic: Clinical - Red Word Triage >> Sep 09, 2023  8:13 AM Natalie Lawson wrote: Red Word that prompted transfer to Nurse Triage: wheezing sounds when coughing and breathing Reason for Disposition  Wheezing is present  Answer Assessment - Initial Assessment Questions 1. ONSET: When did the cough begin?      X 2 weeks  3. SPUTUM: Describe the color of your sputum (none, dry cough; clear, white, yellow, green)     Clear 4. HEMOPTYSIS: Are you coughing up any blood? If so ask: How much? (flecks, streaks, tablespoons, etc.)     None 5. DIFFICULTY BREATHING: Are you having difficulty breathing? If Yes, ask: How bad is it? (e.g., mild, moderate, severe)    - MILD: No SOB at rest, mild SOB with walking, speaks normally in sentences, can lie down, no retractions, pulse < 100.    - MODERATE: SOB at rest, SOB with minimal exertion and prefers to sit, cannot lie down flat, speaks in phrases, mild retractions, audible wheezing, pulse 100-120.    - SEVERE: Very SOB at rest, speaks in single words, struggling to breathe, sitting hunched forward, retractions, pulse > 120      SOB with exertion, intermittent 6. FEVER: Do you have a fever? If Yes, ask: What is your temperature, how was it measured, and when did it start?     None 7. CARDIAC HISTORY: Do you have any history of heart disease? (e.g., heart attack, congestive heart failure)      None 8. LUNG  HISTORY: Do you have any history of lung disease?  (e.g., pulmonary embolus, asthma, emphysema)     COPD 10. OTHER SYMPTOMS: Do you have any other symptoms? (e.g., runny nose, wheezing, chest pain)       Wheezing, chest congestion  Protocols used: Cough - Acute Productive-A-AH

## 2023-09-16 ENCOUNTER — Encounter: Payer: Self-pay | Admitting: Nurse Practitioner

## 2023-09-16 ENCOUNTER — Ambulatory Visit: Admitting: Nurse Practitioner

## 2023-09-16 VITALS — BP 112/72 | HR 68 | Temp 97.7°F | Wt 92.4 lb

## 2023-09-16 DIAGNOSIS — Z1211 Encounter for screening for malignant neoplasm of colon: Secondary | ICD-10-CM

## 2023-09-16 DIAGNOSIS — F17200 Nicotine dependence, unspecified, uncomplicated: Secondary | ICD-10-CM

## 2023-09-16 DIAGNOSIS — E7800 Pure hypercholesterolemia, unspecified: Secondary | ICD-10-CM | POA: Diagnosis not present

## 2023-09-16 DIAGNOSIS — J449 Chronic obstructive pulmonary disease, unspecified: Secondary | ICD-10-CM

## 2023-09-16 DIAGNOSIS — Z23 Encounter for immunization: Secondary | ICD-10-CM | POA: Diagnosis not present

## 2023-09-16 DIAGNOSIS — Z1231 Encounter for screening mammogram for malignant neoplasm of breast: Secondary | ICD-10-CM

## 2023-09-16 DIAGNOSIS — F418 Other specified anxiety disorders: Secondary | ICD-10-CM

## 2023-09-16 MED ORDER — BUDESONIDE-FORMOTEROL FUMARATE 160-4.5 MCG/ACT IN AERO: 2.0000 | INHALATION_SPRAY | Freq: Two times a day (BID) | RESPIRATORY_TRACT | 3 refills | Status: DC

## 2023-09-16 MED ORDER — ROSUVASTATIN CALCIUM 20 MG PO TABS: 20.0000 mg | ORAL_TABLET | Freq: Every day | ORAL | 1 refills | Status: DC

## 2023-09-16 MED ORDER — ESCITALOPRAM OXALATE 20 MG PO TABS: 20.0000 mg | ORAL_TABLET | Freq: Every day | ORAL | 1 refills | Status: DC

## 2023-09-16 NOTE — Assessment & Plan Note (Signed)
Lung screening ordered.

## 2023-09-16 NOTE — Assessment & Plan Note (Signed)
 Chronic.  Controlled.  Continue with current medication regimen of Symbicort .  Pneumonia shot given today.  Labs ordered today.  Return to clinic in 6 months for reevaluation.  Call sooner if concerns arise.

## 2023-09-16 NOTE — Assessment & Plan Note (Signed)
Chronic.  Controlled.  Continue with current medication regimen of Lexapro 20mg .  Refills sent today.  Labs ordered today.  Return to clinic in 6 months for reevaluation.  Call sooner if concerns arise.

## 2023-09-16 NOTE — Assessment & Plan Note (Signed)
 Chronic.  Continue with Rosuvastatin  20mg  daily.  Refills sent today.  Labs ordered today.  Follow up in 6 months.  Call sooner if concerns arise.

## 2023-09-16 NOTE — Progress Notes (Signed)
 BP 112/72   Pulse 68   Temp 97.7 F (36.5 C) (Oral)   Wt 92 lb 6.4 oz (41.9 kg)   LMP 12/29/2007   SpO2 98%   BMI 16.37 kg/m    Subjective:    Patient ID: Natalie Lawson, female    DOB: [birth year], 61 y.o.   MRN: [id number]  HPI: Natalie Lawson is a 63 y.o. female  Chief Complaint  Patient presents with   Cough    Pt states she has had a real bad rattling in chest no longer has it would like to get bloodwork   HYPERLIPIDEMIA Hyperlipidemia status: excellent compliance Satisfied with current treatment?  yes Side effects:  no Medication compliance: excellent compliance Past cholesterol meds: rosuvastatin  (crestor ) Supplements: none Aspirin:  no The ASCVD Risk score (Arnett DK, et al., 2019) failed to calculate for the following reasons:   The valid HDL cholesterol range is 20 to 100 mg/dL Chest pain:  no Coronary artery disease:  no Family history CAD:  no Family history early CAD:  no She feels like her brain is cloudy when she takes the full 20mg .  She has been cutting it in half and tolerating it well.   COPD Using Symbicort .  COPD status: controlled Satisfied with current treatment?: yes Oxygen use: no Dyspnea frequency: with activity Cough frequency: some Rescue inhaler frequency: rarely   Limitation of activity: yes Productive cough: no Last Spirometry: none Pneumovax: Up to Date Influenza: Up to Date  SMOKING CESSATION Smoking Status: Current everyday smoker Smoking Amount: 7 cigarettes per day (down from 1ppd) Smoking Onset: teenage Smoking Quit Date: 8 months Smoking triggers: stress Type of tobacco use: cigarettes Other household members who smoke: no Treatments attempted: Lozenges and Nicoderm patch Pneumovax: up to date    Denies HA, CP, dizziness, palpitations, visual changes, and lower extremity swelling.  MOOD Patient states she feels like its a little better. She is on the lexapro  and feels like it is working well for her.  Denies concerns  regarding her mood at visit today.     Relevant past medical, surgical, family and social history reviewed and updated as indicated. Interim medical history since our last visit reviewed. Allergies and medications reviewed and updated.  Review of Systems  Eyes:  Negative for visual disturbance.  Respiratory:  Negative for cough, chest tightness and shortness of breath.   Cardiovascular:  Negative for chest pain, palpitations and leg swelling.  Neurological:  Negative for dizziness and headaches.  Psychiatric/Behavioral:  Positive for dysphoric mood. Negative for suicidal ideas. The patient is nervous/anxious.     Per HPI unless specifically indicated above     Objective:    BP 112/72   Pulse 68   Temp 97.7 F (36.5 C) (Oral)   Wt 92 lb 6.4 oz (41.9 kg)   LMP 12/29/2007   SpO2 98%   BMI 16.37 kg/m   Wt Readings from Last 3 Encounters:  09/16/23 92 lb 6.4 oz (41.9 kg)  03/07/23 86 lb (39 kg)  07/14/21 88 lb 3.2 oz (40 kg)    Physical Exam Vitals and nursing note reviewed.  Constitutional:      General: She is not in acute distress.    Appearance: Normal appearance. She is normal weight. She is not ill-appearing, toxic-appearing or diaphoretic.  HENT:     Head: Normocephalic.     Right Ear: External ear normal.     Left Ear: External ear normal.     Nose: Nose normal.  Mouth/Throat:     Mouth: Mucous membranes are moist.     Pharynx: Oropharynx is clear.  Eyes:     General:        Right eye: No discharge.        Left eye: No discharge.     Extraocular Movements: Extraocular movements intact.     Conjunctiva/sclera: Conjunctivae normal.     Pupils: Pupils are equal, round, and reactive to light.  Cardiovascular:     Rate and Rhythm: Normal rate and regular rhythm.     Heart sounds: No murmur heard. Pulmonary:     Effort: Pulmonary effort is normal. No respiratory distress.     Breath sounds: Normal breath sounds. No wheezing or rales.  Musculoskeletal:      Cervical back: Normal range of motion and neck supple.  Skin:    General: Skin is warm and dry.     Capillary Refill: Capillary refill takes less than 2 seconds.  Neurological:     General: No focal deficit present.     Mental Status: She is alert and oriented to person, place, and time. Mental status is at baseline.  Psychiatric:        Mood and Affect: Mood normal.        Behavior: Behavior normal.        Thought Content: Thought content normal.        Judgment: Judgment normal.     Results for orders placed or performed in visit on 03/07/23  Microscopic Examination   Collection Time: 03/07/23  3:27 PM   Urine  Result Value Ref Range   WBC, UA 0-5 0 - 5 /hpf   RBC, Urine 0-2 0 - 2 /hpf   Epithelial Cells (non renal) 0-10 0 - 10 /hpf   Bacteria, UA None seen None seen/Few  Urinalysis, Routine w reflex microscopic   Collection Time: 03/07/23  3:27 PM  Result Value Ref Range   Specific Gravity, UA 1.015 1.005 - 1.030   pH, UA 6.5 5.0 - 7.5   Color, UA Yellow Yellow   Appearance Ur Clear Clear   Leukocytes,UA Trace (A) Negative   Protein,UA Negative Negative/Trace   Glucose, UA Negative Negative   Ketones, UA Negative Negative   RBC, UA Trace (A) Negative   Bilirubin, UA Negative Negative   Urobilinogen, Ur 1.0 0.2 - 1.0 mg/dL   Nitrite, UA Negative Negative   Microscopic Examination See below:   CBC with Differential/Platelet   Collection Time: 03/07/23  3:28 PM  Result Value Ref Range   WBC 7.1 3.4 - 10.8 x10E3/uL   RBC 4.67 3.77 - 5.28 x10E6/uL   Hemoglobin 13.4 11.1 - 15.9 g/dL   Hematocrit 59.1 65.9 - 46.6 %   MCV 87 79 - 97 fL   MCH 28.7 26.6 - 33.0 pg   MCHC 32.8 31.5 - 35.7 g/dL   RDW 86.3 88.2 - 84.5 %   Platelets 259 150 - 450 x10E3/uL   Neutrophils 54 Not Estab. %   Lymphs 35 Not Estab. %   Monocytes 10 Not Estab. %   Eos 1 Not Estab. %   Basos 0 Not Estab. %   Neutrophils Absolute 3.8 1.4 - 7.0 x10E3/uL   Lymphocytes Absolute 2.5 0.7 - 3.1 x10E3/uL    Monocytes Absolute 0.7 0.1 - 0.9 x10E3/uL   EOS (ABSOLUTE) 0.1 0.0 - 0.4 x10E3/uL   Basophils Absolute 0.0 0.0 - 0.2 x10E3/uL   Immature Granulocytes 0 Not Estab. %   Immature  Grans (Abs) 0.0 0.0 - 0.1 x10E3/uL  Comprehensive metabolic panel   Collection Time: 03/07/23  3:28 PM  Result Value Ref Range   Glucose 74 70 - 99 mg/dL   BUN 8 8 - 27 mg/dL   Creatinine, Ser 9.35 0.57 - 1.00 mg/dL   eGFR 899 >40 fO/fpw/8.26   BUN/Creatinine Ratio 13 12 - 28   Sodium 149 (H) 134 - 144 mmol/L   Potassium 3.8 3.5 - 5.2 mmol/L   Chloride 96 96 - 106 mmol/L   CO2 27 20 - 29 mmol/L   Calcium  9.6 8.7 - 10.3 mg/dL   Total Protein 6.4 6.0 - 8.5 g/dL   Albumin 4.3 3.9 - 4.9 g/dL   Globulin, Total 2.1 1.5 - 4.5 g/dL   Bilirubin Total 0.2 0.0 - 1.2 mg/dL   Alkaline Phosphatase 59 44 - 121 IU/L   AST 32 0 - 40 IU/L   ALT 23 0 - 32 IU/L  Lipid panel   Collection Time: 03/07/23  3:28 PM  Result Value Ref Range   Cholesterol, Total 214 (H) 100 - 199 mg/dL   Triglycerides 60 0 - 149 mg/dL   HDL 99 >60 mg/dL   VLDL Cholesterol Cal 11 5 - 40 mg/dL   LDL Chol Calc (NIH) 895 (H) 0 - 99 mg/dL   Chol/HDL Ratio 2.2 0.0 - 4.4 ratio  TSH   Collection Time: 03/07/23  3:28 PM  Result Value Ref Range   TSH 3.860 0.450 - 4.500 uIU/mL  Vitamin D  (25 hydroxy)   Collection Time: 03/07/23  3:28 PM  Result Value Ref Range   Vit D, 25-Hydroxy 32.1 30.0 - 100.0 ng/mL      Assessment & Plan:   Problem List Items Addressed This Visit       Respiratory   COPD (chronic obstructive pulmonary disease) (HCC) - Primary   Chronic.  Controlled.  Continue with current medication regimen of Symbicort .  Pneumonia shot given today.  Labs ordered today.  Return to clinic in 6 months for reevaluation.  Call sooner if concerns arise.        Relevant Medications   budesonide -formoterol  (SYMBICORT ) 160-4.5 MCG/ACT inhaler   Other Relevant Orders   Ambulatory Referral Lung Cancer Screening [facility]     Other    Smoker   Lung screening ordered.      Relevant Orders   Ambulatory Referral Lung Cancer Screening [facility]   Hypercholesteremia   Chronic.  Continue with Rosuvastatin  20mg  daily.  Refills sent today.  Labs ordered today.  Follow up in 6 months.  Call sooner if concerns arise.       Relevant Medications   rosuvastatin  (CRESTOR ) 20 MG tablet   Other Relevant Orders   Comp Met (CMET)   Lipid Profile   Mixed anxiety and depressive disorder   Chronic.  Controlled.  Continue with current medication regimen of Lexapro  20mg .  Refills sent today.  Labs ordered today.  Return to clinic in 6 months for reevaluation.  Call sooner if concerns arise.        Relevant Medications   escitalopram  (LEXAPRO ) 20 MG tablet   Other Visit Diagnoses       Screening for colon cancer       Relevant Orders   Ambulatory referral to Gastroenterology     Encounter for screening mammogram for malignant neoplasm of breast       Relevant Orders   MM 3D SCREENING MAMMOGRAM BILATERAL BREAST     Need  for vaccination against Streptococcus pneumoniae       Relevant Orders   Pneumococcal conjugate vaccine 20-valent (Prevnar 20)        Follow up plan: Return in about 6 months (around 03/18/2024) for Physical and Fasting labs.

## 2023-09-17 ENCOUNTER — Other Ambulatory Visit: Payer: Self-pay

## 2023-09-17 ENCOUNTER — Ambulatory Visit: Payer: Self-pay | Admitting: Nurse Practitioner

## 2023-09-17 ENCOUNTER — Telehealth: Payer: Self-pay

## 2023-09-17 DIAGNOSIS — Z1211 Encounter for screening for malignant neoplasm of colon: Secondary | ICD-10-CM

## 2023-09-17 LAB — COMPREHENSIVE METABOLIC PANEL WITH GFR
ALT: 29 IU/L (ref 0–32)
AST: 34 IU/L (ref 0–40)
Albumin: 4.3 g/dL (ref 3.9–4.9)
Alkaline Phosphatase: 54 IU/L (ref 44–121)
BUN/Creatinine Ratio: 17 (ref 12–28)
BUN: 11 mg/dL (ref 8–27)
Bilirubin Total: 0.3 mg/dL (ref 0.0–1.2)
CO2: 24 mmol/L (ref 20–29)
Calcium: 9.7 mg/dL (ref 8.7–10.3)
Chloride: 97 mmol/L (ref 96–106)
Creatinine, Ser: 0.63 mg/dL (ref 0.57–1.00)
Globulin, Total: 2.6 g/dL (ref 1.5–4.5)
Glucose: 91 mg/dL (ref 70–99)
Potassium: 4.3 mmol/L (ref 3.5–5.2)
Sodium: 138 mmol/L (ref 134–144)
Total Protein: 6.9 g/dL (ref 6.0–8.5)
eGFR: 100 mL/min/1.73 (ref >59)

## 2023-09-17 LAB — LIPID PANEL
Chol/HDL Ratio: 2.4 ratio (ref 0.0–4.4)
Cholesterol, Total: 254 mg/dL — ABNORMAL HIGH (ref 100–199)
HDL: 104 mg/dL (ref >39)
LDL Chol Calc (NIH): 136 mg/dL — ABNORMAL HIGH (ref 0–99)
Triglycerides: 83 mg/dL (ref 0–149)
VLDL Cholesterol Cal: 14 mg/dL (ref 5–40)

## 2023-09-17 MED ORDER — NA SULFATE-K SULFATE-MG SULF 17.5-3.13-1.6 GM/177ML PO SOLN: 1.0000 | Freq: Once | ORAL | 0 refills | Status: AC

## 2023-09-17 NOTE — Telephone Encounter (Signed)
 Gastroenterology Pre-Procedure Review  Request Date: 12/24/23 Requesting Physician: Dr. Jinny  PATIENT REVIEW QUESTIONS: The patient responded to the following health history questions as indicated:    1. Are you having any GI issues? no 2. Do you have a personal history of Polyps? no 3. Do you have a family history of Colon Cancer or Polyps? no 4. Diabetes Mellitus? no 5. Joint replacements in the past 12 months?no 6. Major health problems in the past 3 months?no 7. Any artificial heart valves, MVP, or defibrillator?no 8. Lung problems? COPD-medical clearance sent to PCP    MEDICATIONS & ALLERGIES:    Patient reports the following regarding taking any anticoagulation/antiplatelet therapy:   Plavix, Coumadin, Eliquis, Xarelto, Lovenox, Pradaxa, Brilinta, or Effient? no Aspirin? no  Patient confirms/reports the following medications:  Current Outpatient Medications  Medication Sig Dispense Refill   budesonide -formoterol  (SYMBICORT ) 160-4.5 MCG/ACT inhaler Inhale 2 puffs into the lungs 2 (two) times daily. 1 each 3   escitalopram  (LEXAPRO ) 20 MG tablet Take 1 tablet (20 mg total) by mouth daily. 90 tablet 1   Multiple Vitamin (MULTIVITAMIN) tablet Take 1 tablet by mouth daily.     rosuvastatin  (CRESTOR ) 20 MG tablet Take 1 tablet (20 mg total) by mouth daily. 90 tablet 1   Vitamin D , Cholecalciferol, 25 MCG (1000 UT) CAPS 1 capsule (Patient not taking: Reported on 03/07/2023)     No current facility-administered medications for this visit.    Patient confirms/reports the following allergies:  No Known Allergies  No orders of the defined types were placed in this encounter.   AUTHORIZATION INFORMATION Primary Insurance: 1D#: Group #:  Secondary Insurance: 1D#: Group #:  SCHEDULE INFORMATION: Date:  Time: Location:

## 2023-09-19 ENCOUNTER — Telehealth: Payer: Self-pay

## 2023-09-19 DIAGNOSIS — Z87891 Personal history of nicotine dependence: Secondary | ICD-10-CM

## 2023-09-19 DIAGNOSIS — Z122 Encounter for screening for malignant neoplasm of respiratory organs: Secondary | ICD-10-CM

## 2023-09-19 DIAGNOSIS — F17210 Nicotine dependence, cigarettes, uncomplicated: Secondary | ICD-10-CM

## 2023-09-19 NOTE — Telephone Encounter (Signed)
 Lung Cancer Screening Narrative/Criteria Questionnaire (Cigarette Smokers Only- No Cigars/Pipes/vapes)   Jakiera Ehler   SDMV:09/26/2023 at 11:30        1960/08/14   LDCT: 10/01/2023 at 9:30 am at [facility].     63 y.o.   Phone: [phone]  Lung Screening Narrative (confirm age 36-77 yrs Medicare / 50-80 yrs Private pay insurance)   Insurance information:[employer]   Referring Provider: Holdsworth,NP   This screening involves an initial phone call with a team member from our program. It is called a shared decision making visit. The initial meeting is required by  insurance and Medicare to make sure you understand the program. This appointment takes about 15-20 minutes to complete. You will complete the screening scan at your scheduled date/time.  This scan takes about 5-10 minutes to complete. You can eat and drink normally before and after the scan.  Criteria questions for Lung Cancer Screening:   Are you a current or former smoker? Current Age began smoking: 29   If you are a former smoker, what year did you quit smoking? Never quit. (within 15 yrs)   To calculate your smoking history, I need an accurate estimate of how many packs of cigarettes you smoked per day and for how many years. (Not just the number of PPD you are now smoking)   Years smoking 44 x Packs per day 1 = Pack years 44   (at least 20 pack yrs)   (Make sure they understand that we need to know how much they have smoked in the past, not just the number of PPD they are smoking now)  Do you have a personal history of cancer?  No    Do you have a family history of cancer? Yes  (cancer type and and relative) Sister had breast cancer.   Are you coughing up blood?  No  Have you had unexplained weight loss of 15 lbs or more in the last 6 months? No  It looks like you meet all criteria.  When would be a good time for us  to schedule you for this screening?   Additional information: N/A

## 2023-09-26 ENCOUNTER — Encounter: Payer: Self-pay | Admitting: Physician Assistant

## 2023-09-26 ENCOUNTER — Ambulatory Visit: Admitting: Physician Assistant

## 2023-09-26 DIAGNOSIS — F17210 Nicotine dependence, cigarettes, uncomplicated: Secondary | ICD-10-CM | POA: Diagnosis not present

## 2023-09-26 NOTE — Patient Instructions (Signed)

## 2023-09-26 NOTE — Progress Notes (Signed)
 Virtual Visit via Telephone Note  I connected with Natalie Lawson on 09/26/23 at  11:43 AM by telephone and verified that I am speaking with the correct person using two identifiers.  Location: Patient: home Provider: working virtually from home   I discussed the limitations, risks, security and privacy concerns of performing an evaluation and management service by telephone and the availability of in person appointments. I also discussed with the patient that there may be a patient responsible charge related to this service. The patient expressed understanding and agreed to proceed.     Shared Decision Making Visit Lung Cancer Screening Program [id number])   Eligibility: Age 63 Pack Years Smoking History Calculation 25 (# packs/per year x # years smoked) Recent History of coughing up blood  No Unexplained weight loss? No ( >Than 15 pounds within the last 6 months ) Prior History Lung / other cancer No (Diagnosis within the last 5 years already requiring surveillance chest CT Scans). Smoking Status Current Smoker  Visit Components: Discussion included one or more decision making aids. Yes Discussion included risk/benefits of screening. Yes Discussion included potential follow up diagnostic testing for abnormal scans. Yes Discussion included meaning and risk of over diagnosis. Yes Discussion included meaning and risk of False Positives. Yes Discussion included meaning of total radiation exposure. Yes  Counseling Included: Importance of adherence to annual lung cancer LDCT screening. Yes Impact of comorbidities on ability to participate in the program. Yes Ability and willingness to under diagnostic treatment: Yes  Smoking Cessation Counseling: Current Smokers:  Discussed importance of smoking cessation. Yes Information about tobacco cessation classes and interventions provided to patient. Yes Symptomatic Patient. No Diagnosis Code: Tobacco Use Z72.0 Asymptomatic Patient  Yes  Counseling (Intermediate counseling: > three minutes counseling) H9563 Information about tobacco cessation classes and interventions provided to patient. Yes Written Order for Lung Cancer Screening with LDCT placed in Epic. Yes (CT Chest Lung Cancer Screening Low Dose W/O CM) [id number] Z12.2-Screening of respiratory organs Z87.891-Personal history of nicotine dependence   I have spent 25 minutes of face to face/ virtual visit  time with the patient discussing the risks and benefits of lung cancer screening. We discussed the above noted topics. We paused at intervals to allow for questions to be asked and answered to ensure understanding.We discussed that the single most powerful action that anyone can take to decrease their risk of developing lung cancer is to quit smoking.  We discussed options for tools to aid in quitting smoking including nicotine replacement therapy, non-nicotine medications, support groups, Quit Smart classes, and behavior modification. We discussed that often times setting smaller, more achievable goals, such as eliminating 1 cigarette a day for a week and then 2 cigarettes a day for a week can be helpful in slowly decreasing the number of cigarettes smoked. I provided  them  with smoking cessation  information  with contact information for community resources, classes, free nicotine replacement therapy, and access to mobile apps, text messaging, and on-line smoking cessation help. I have also provided  them  the office contact information in the event they have any questions. We discussed the time and location of the scan, and that either Natalie Curly RN, Natalie Doom, RN  or I will call / send a letter with the results within 24-72 hours of receiving them. The patient verbalized understanding of all of  the above and had no further questions upon leaving the office. They have my contact information in the event they have any further  questions.  I spent 2 minutes counseling  on smoking cessation and the health risks of continued tobacco abuse.  I explained to the patient that there has been a high incidence of coronary artery disease noted on these exams. I explained that this is a non-gated exam therefore degree or severity cannot be determined. This patient is on statin therapy. I have asked the patient to follow-up with their PCP regarding any incidental finding of coronary artery disease and management with diet or medication as their PCP  feels is clinically indicated. The patient verbalized understanding of the above and had no further questions upon completion of the visit.    Natalie Lawson Natalie Maurer, PA-C

## 2023-10-01 ENCOUNTER — Telehealth: Payer: Self-pay | Admitting: Nurse Practitioner

## 2023-10-01 ENCOUNTER — Ambulatory Visit
Admission: RE | Admit: 2023-10-01 | Discharge: 2023-10-01 | Disposition: A | Discharge status: Home / Self Care | Source: Ambulatory Visit | Attending: Acute Care | Admitting: Acute Care

## 2023-10-01 ENCOUNTER — Telehealth: Payer: Self-pay

## 2023-10-01 DIAGNOSIS — Z122 Encounter for screening for malignant neoplasm of respiratory organs: Secondary | ICD-10-CM | POA: Diagnosis present

## 2023-10-01 DIAGNOSIS — Z87891 Personal history of nicotine dependence: Secondary | ICD-10-CM | POA: Diagnosis present

## 2023-10-01 DIAGNOSIS — F17210 Nicotine dependence, cigarettes, uncomplicated: Secondary | ICD-10-CM | POA: Insufficient documentation

## 2023-10-01 NOTE — Telephone Encounter (Signed)
 Medical clearance form received for patient from [facility]. Patient needs clearance appointment between 11/25/23 and 12/22/23 for a procedure scheduled for 12/24/23. Please call to schedule clearance appointment in the time frame above. Clearance form placed in the incomplete bin until patient is seen.

## 2023-10-01 NOTE — Telephone Encounter (Signed)
 Called patient and left a message for her to call back to get scheduled. Patient needs clearance appointment between 11/25/23 and 12/22/23 for a procedure scheduled for 12/24/23

## 2023-10-02 NOTE — Telephone Encounter (Signed)
 Appt scheduled

## 2023-10-07 ENCOUNTER — Other Ambulatory Visit: Payer: Self-pay | Admitting: Acute Care

## 2023-10-07 DIAGNOSIS — F17210 Nicotine dependence, cigarettes, uncomplicated: Secondary | ICD-10-CM

## 2023-10-07 DIAGNOSIS — Z122 Encounter for screening for malignant neoplasm of respiratory organs: Secondary | ICD-10-CM

## 2023-10-07 DIAGNOSIS — Z87891 Personal history of nicotine dependence: Secondary | ICD-10-CM

## 2023-10-29 ENCOUNTER — Other Ambulatory Visit: Payer: Self-pay

## 2023-10-29 MED ORDER — ROSUVASTATIN CALCIUM 20 MG PO TABS: 20.0000 mg | ORAL_TABLET | Freq: Every day | ORAL | 1 refills | Status: DC

## 2023-10-29 MED ORDER — ESCITALOPRAM OXALATE 20 MG PO TABS: 20.0000 mg | ORAL_TABLET | Freq: Every day | ORAL | 1 refills | Status: DC

## 2023-10-29 MED ORDER — BUDESONIDE-FORMOTEROL FUMARATE 160-4.5 MCG/ACT IN AERO: 2.0000 | INHALATION_SPRAY | Freq: Two times a day (BID) | RESPIRATORY_TRACT | 3 refills | Status: DC

## 2023-10-29 NOTE — Telephone Encounter (Signed)
 Copied from CRM #[id number]. Topic: Clinical - Prescription Issue >> Oct 28, 2023 11:00 AM Tobias CROME wrote: Reason for CRM: Patient states [facility] is unable to fill prescriptions as her insurance will not cover the prescriptions.   Patient requesting following prescriptions to be sent to [id number] - [place], [place] - [address] Lexapro , crestor  and symbicort   Best callback number: [phone]

## 2023-12-02 ENCOUNTER — Encounter: Payer: Self-pay | Admitting: Nurse Practitioner

## 2023-12-02 ENCOUNTER — Ambulatory Visit: Admitting: Nurse Practitioner

## 2023-12-02 VITALS — BP 132/82 | HR 67 | Wt 94.6 lb

## 2023-12-02 DIAGNOSIS — E7800 Pure hypercholesterolemia, unspecified: Secondary | ICD-10-CM

## 2023-12-02 DIAGNOSIS — F418 Other specified anxiety disorders: Secondary | ICD-10-CM | POA: Diagnosis not present

## 2023-12-02 DIAGNOSIS — J449 Chronic obstructive pulmonary disease, unspecified: Secondary | ICD-10-CM | POA: Diagnosis not present

## 2023-12-02 DIAGNOSIS — F17200 Nicotine dependence, unspecified, uncomplicated: Secondary | ICD-10-CM

## 2023-12-02 DIAGNOSIS — Z01818 Encounter for other preprocedural examination: Secondary | ICD-10-CM | POA: Diagnosis not present

## 2023-12-02 NOTE — Progress Notes (Signed)
 BP 132/82   Pulse 67   Wt 94 lb 9.6 oz (42.9 kg)   LMP 12/29/2007   SpO2 90%   BMI 16.76 kg/m    Subjective:    Patient ID: Natalie Lawson, female    DOB: Aug 28, 1960, 63 y.o.   MRN: [id number]  HPI: Natalie Lawson is a 63 y.o. female  Chief Complaint  Patient presents with   Procedure   12/02/2023  Assessment:  Preoperative evaluation and clearance show: No contraindications to planned surgery and patient is cleared for diagnosis for surgical procedure.  Other diagnoses affecting surgical risk include: Other COPD, hyperlipidemia     Type of Surgery: Low, <1% perioperative cardiac risk (cataract, minor head & neck, minor prostate, superficial surgeries)   Lee's Revised Cardiac Index: 0 Risk class I, very low, 0.4% risk of cardiac complications   Plan:  1. Patient requires endocarditis prophylaxis: no. 2. GENERAL PREOP INSTRUCTIONS: Proceed with surgery as planned. No food or liquids the morning of surgery. Call surgeon if develops respiratory illness, fever, or other illness. 3. Medications to Hold: morning of appointment unless instructed otherwise by surgeon. 4. EKG:  12/02/2023 : No acute ST changes noted  5. Ordered Labs: CBC, CMP, LIPID  From a medical standpoint the patient is an acceptable candidates to undergo general anesthesia for surgery.It is recommended to correct electrolytes and to keep the patient euvolemic and avoid significant fluid shifts during surgery.  Labs reviewed and pt is cleared for surgery. Pt denies a hx of adverse reactions to anesthesia.    Natalie Lawson is a 63 y.o. female who presents to the office today for a preoperative consultation at the request of Dr. Jinny, who will perform a  Colonoscopy  on 12/24/23   Current Complaints: None  Past Medical History:  Diagnosis Date   Allergy    COPD (chronic obstructive pulmonary disease) (HCC)    Hyperlipidemia    Family History  Problem Relation Age of Onset   Brain cancer Mother    Asthma  Father    Breast cancer Sister    Healthy Sister    Cancer Brother    Current Outpatient Medications  Medication Sig Dispense Refill   budesonide -formoterol  (SYMBICORT ) 160-4.5 MCG/ACT inhaler Inhale 2 puffs into the lungs 2 (two) times daily. 1 each 3   escitalopram  (LEXAPRO ) 20 MG tablet Take 1 tablet (20 mg total) by mouth daily. 90 tablet 1   Multiple Vitamin (MULTIVITAMIN) tablet Take 1 tablet by mouth daily.     rosuvastatin  (CRESTOR ) 20 MG tablet Take 1 tablet (20 mg total) by mouth daily. 90 tablet 1   Vitamin D , Cholecalciferol, 25 MCG (1000 UT) CAPS 1 capsule (Patient not taking: Reported on 03/07/2023)     No current facility-administered medications for this visit.   No Known Allergies Social History   Socioeconomic History   Marital status: Single    Spouse name: Not on file   Number of children: Not on file   Years of education: Not on file   Highest education level: Not on file  Occupational History   Not on file  Tobacco Use   Smoking status: Every Day    Current packs/day: 0.75    Types: Cigarettes   Smokeless tobacco: Never  Vaping Use   Vaping status: Never Used  Substance and Sexual Activity   Alcohol use: No   Drug use: No   Sexual activity: Never  Other Topics Concern   Not on file  Social History Narrative  Not on file   Social Drivers of Health   Financial Resource Strain: Low Risk  (01/22/2023)   Received from [facility]   Overall Financial Resource Strain (CARDIA)    Difficulty of Paying Living Expenses: Not very hard  Food Insecurity: No Food Insecurity (01/22/2023)   Received from [facility]   Hunger Vital Sign    Within the past 12 months, you worried that your food would run out before you got the money to buy more.: Never true    Within the past 12 months, the food you bought just didn't last and you didn't have money to get more.: Never true  Transportation Needs: No Transportation Needs  (01/22/2023)   Received from [facility] - Transportation    In the past 12 months, has lack of transportation kept you from medical appointments or from getting medications?: No    Lack of Transportation (Non-Medical): No  Physical Activity: Not on file  Stress: Not on file  Social Connections: Unknown (07/18/2021)   Received from [facility]   Social Network    Social Network: Not on file  Intimate Partner Violence: Unknown (06/09/2021)   Received from [facility]   HITS    Physically Hurt: Not on file    Insult or Talk Down To: Not on file    Threaten Physical Harm: Not on file    Scream or Curse: Not on file     Preoperative Risk Factors  1. Major predictors that require intensive management and may lead to delay in or cancellation of the operative procedure unless emergent:  noUnstable coronary syndromes including unstable or severe angina or recent MI  noDecompensated heart failure including NYHA functional class 4 or worsening or new onset HF  no Significant arrhythmia including high grade AV block, symptomatic ventricular arrhythmias, supraventricular arrhythmias with a ventricular rate >100 bpm at rest, symptomatic bradycardia, and newly recognized ventricular tachycardia  no Severe heart valve disease including severe aortic stenosis or symptomatic mitral stenosis  2. Additional independent predictors of major cardiac complications:  noHgh risk type of surgery (Vascular surgery and any open intraperitoneal or intrathoracic procedures)  Other clinical predictors that warrant careful assessment of current status  noHistory of ischemic heart disease noHistory of CVA no  History of compensated heart failure or prior heart failure no Diabetes mellitus on Insulin no Renal insufficiency  3. Perioperative cardiac and long term risk is increased in pts unable to meet a 4-MET demand during most normal daily activities:  Yes Ability to climb  2 flights of stairs or walk four blocks  Objective:  BP 132/82   Pulse 67   Wt 94 lb 9.6 oz (42.9 kg)   LMP 12/29/2007   SpO2 90%   BMI 16.76 kg/m   Body mass index is 16.76 kg/m.     Natalie Lawson 12/02/23  Relevant past medical, surgical, family and social history reviewed and updated as indicated. Interim medical history since our last visit reviewed. Allergies and medications reviewed and updated.  Review of Systems  Eyes:  Negative for visual disturbance.  Respiratory:  Positive for shortness of breath. Negative for cough and chest tightness.   Cardiovascular:  Negative for chest pain, palpitations and leg swelling.  Neurological:  Negative for dizziness and headaches.    Per HPI unless specifically indicated above     Objective:    BP 132/82   Pulse 67   Wt 94 lb 9.6 oz (  42.9 kg)   LMP 12/29/2007   SpO2 90%   BMI 16.76 kg/m   Wt Readings from Last 3 Encounters:  12/02/23 94 lb 9.6 oz (42.9 kg)  10/01/23 95 lb (43.1 kg)  09/16/23 92 lb 6.4 oz (41.9 kg)    Physical Exam Vitals and nursing note reviewed.  Constitutional:      General: She is awake. She is not in acute distress.    Appearance: Normal appearance. She is well-developed. She is not ill-appearing.  HENT:     Head: Normocephalic and atraumatic.     Right Ear: Hearing, tympanic membrane, ear canal and external ear normal. No drainage.     Left Ear: Hearing, tympanic membrane, ear canal and external ear normal. No drainage.     Nose: Nose normal.     Right Sinus: No maxillary sinus tenderness or frontal sinus tenderness.     Left Sinus: No maxillary sinus tenderness or frontal sinus tenderness.     Mouth/Throat:     Mouth: Mucous membranes are moist.     Pharynx: Oropharynx is clear. Uvula midline. No pharyngeal swelling, oropharyngeal exudate or posterior oropharyngeal erythema.  Eyes:     General: Lids are normal.        Right eye: No discharge.        Left eye: No discharge.      Extraocular Movements: Extraocular movements intact.     Conjunctiva/sclera: Conjunctivae normal.     Pupils: Pupils are equal, round, and reactive to light.     Visual Fields: Right eye visual fields normal and left eye visual fields normal.  Neck:     Thyroid : No thyromegaly.     Vascular: No carotid bruit.     Trachea: Trachea normal.  Cardiovascular:     Rate and Rhythm: Normal rate and regular rhythm.     Heart sounds: Normal heart sounds. No murmur heard.    No gallop.  Pulmonary:     Effort: Pulmonary effort is normal. No accessory muscle usage or respiratory distress.     Breath sounds: Normal breath sounds.  Chest:  Breasts:    Right: Normal.     Left: Normal.  Abdominal:     General: Bowel sounds are normal.     Palpations: Abdomen is soft. There is no hepatomegaly or splenomegaly.     Tenderness: There is no abdominal tenderness.  Musculoskeletal:        General: Normal range of motion.     Cervical back: Normal range of motion and neck supple.     Right lower leg: No edema.     Left lower leg: No edema.  Lymphadenopathy:     Head:     Right side of head: No submental, submandibular, tonsillar, preauricular or posterior auricular adenopathy.     Left side of head: No submental, submandibular, tonsillar, preauricular or posterior auricular adenopathy.     Cervical: No cervical adenopathy.     Upper Body:     Right upper body: No supraclavicular, axillary or pectoral adenopathy.     Left upper body: No supraclavicular, axillary or pectoral adenopathy.  Skin:    General: Skin is warm and dry.     Capillary Refill: Capillary refill takes less than 2 seconds.     Findings: No rash.  Neurological:     Mental Status: She is alert and oriented to person, place, and time.     Gait: Gait is intact.  Psychiatric:  Attention and Perception: Attention normal.        Mood and Affect: Mood normal.        Speech: Speech normal.        Behavior: Behavior normal.  Behavior is cooperative.        Thought Content: Thought content normal.        Judgment: Judgment normal.     Results for orders placed or performed in visit on 09/16/23  Comp Met (CMET)   Collection Time: 09/16/23  2:19 PM  Result Value Ref Range   Glucose 91 70 - 99 mg/dL   BUN 11 8 - 27 mg/dL   Creatinine, Ser 9.36 0.57 - 1.00 mg/dL   eGFR 899 >40 fO/fpw/8.26   BUN/Creatinine Ratio 17 12 - 28   Sodium 138 134 - 144 mmol/L   Potassium 4.3 3.5 - 5.2 mmol/L   Chloride 97 96 - 106 mmol/L   CO2 24 20 - 29 mmol/L   Calcium  9.7 8.7 - 10.3 mg/dL   Total Protein 6.9 6.0 - 8.5 g/dL   Albumin 4.3 3.9 - 4.9 g/dL   Globulin, Total 2.6 1.5 - 4.5 g/dL   Bilirubin Total 0.3 0.0 - 1.2 mg/dL   Alkaline Phosphatase 54 44 - 121 IU/L   AST 34 0 - 40 IU/L   ALT 29 0 - 32 IU/L  Lipid Profile   Collection Time: 09/16/23  2:19 PM  Result Value Ref Range   Cholesterol, Total 254 (H) 100 - 199 mg/dL   Triglycerides 83 0 - 149 mg/dL   HDL 895 >60 mg/dL   VLDL Cholesterol Cal 14 5 - 40 mg/dL   LDL Chol Calc (NIH) 863 (H) 0 - 99 mg/dL   Chol/HDL Ratio 2.4 0.0 - 4.4 ratio      Assessment & Plan:   Problem List Items Addressed This Visit       Respiratory   COPD (chronic obstructive pulmonary disease) (HCC)   Relevant Orders   Comp Met (CMET)   CBC w/Diff   EKG 12-Lead     Other   Smoker   Relevant Orders   EKG 12-Lead   Hypercholesteremia   Relevant Orders   CBC w/Diff   Lipid panel   EKG 12-Lead   Mixed anxiety and depressive disorder   Relevant Orders   CBC w/Diff   EKG 12-Lead   Other Visit Diagnoses       Pre-op evaluation    -  Primary   EKG showed NSR. CBC, CMP and Lipid ordered.        Follow up plan: No follow-ups on file.

## 2023-12-03 ENCOUNTER — Ambulatory Visit: Payer: Self-pay | Admitting: Nurse Practitioner

## 2023-12-03 LAB — CBC WITH DIFFERENTIAL/PLATELET
Basophils Absolute: 0 x10E3/uL (ref 0.0–0.2)
Basos: 1 %
EOS (ABSOLUTE): 0.3 x10E3/uL (ref 0.0–0.4)
Eos: 4 %
Hematocrit: 49.4 % — ABNORMAL HIGH (ref 34.0–46.6)
Hemoglobin: 15.7 g/dL (ref 11.1–15.9)
Immature Grans (Abs): 0 x10E3/uL (ref 0.0–0.1)
Immature Granulocytes: 0 %
Lymphocytes Absolute: 2.4 x10E3/uL (ref 0.7–3.1)
Lymphs: 30 %
MCH: 28.7 pg (ref 26.6–33.0)
MCHC: 31.8 g/dL (ref 31.5–35.7)
MCV: 90 fL (ref 79–97)
Monocytes Absolute: 0.5 x10E3/uL (ref 0.1–0.9)
Monocytes: 6 %
Neutrophils Absolute: 4.9 x10E3/uL (ref 1.4–7.0)
Neutrophils: 59 %
Platelets: 244 x10E3/uL (ref 150–450)
RBC: 5.47 x10E6/uL — ABNORMAL HIGH (ref 3.77–5.28)
RDW: 13.8 % (ref 11.7–15.4)
WBC: 8.1 x10E3/uL (ref 3.4–10.8)

## 2023-12-03 LAB — COMPREHENSIVE METABOLIC PANEL WITH GFR
ALT: 29 IU/L (ref 0–32)
AST: 44 IU/L — ABNORMAL HIGH (ref 0–40)
Albumin: 4.9 g/dL (ref 3.9–4.9)
Alkaline Phosphatase: 57 IU/L (ref 49–135)
BUN/Creatinine Ratio: 12 (ref 12–28)
BUN: 8 mg/dL (ref 8–27)
Bilirubin Total: 0.3 mg/dL (ref 0.0–1.2)
CO2: 22 mmol/L (ref 20–29)
Calcium: 10.1 mg/dL (ref 8.7–10.3)
Chloride: 99 mmol/L (ref 96–106)
Creatinine, Ser: 0.69 mg/dL (ref 0.57–1.00)
Globulin, Total: 2.5 g/dL (ref 1.5–4.5)
Glucose: 87 mg/dL (ref 70–99)
Potassium: 4.4 mmol/L (ref 3.5–5.2)
Sodium: 140 mmol/L (ref 134–144)
Total Protein: 7.4 g/dL (ref 6.0–8.5)
eGFR: 98 mL/min/1.73 (ref >59)

## 2023-12-03 LAB — LIPID PANEL
Chol/HDL Ratio: 2.1 ratio (ref 0.0–4.4)
Cholesterol, Total: 225 mg/dL — ABNORMAL HIGH (ref 100–199)
HDL: 108 mg/dL (ref >39)
LDL Chol Calc (NIH): 102 mg/dL — ABNORMAL HIGH (ref 0–99)
Triglycerides: 86 mg/dL (ref 0–149)
VLDL Cholesterol Cal: 15 mg/dL (ref 5–40)

## 2023-12-17 ENCOUNTER — Telehealth: Payer: Self-pay | Admitting: Nurse Practitioner

## 2023-12-17 NOTE — Telephone Encounter (Signed)
 Copied from CRM [id number]. Topic: Clinical - Medication Prior Auth >> Dec 17, 2023  3:21 PM Antony RAMAN wrote: Reason for CRM: on July 15th paperwork was sent to doc from [facility] gastroenterology sent over paperwork for the pts colonoscopy, needs to be signed by doc and sent back before the procedure which is 10/21

## 2023-12-18 NOTE — Telephone Encounter (Signed)
 Letter printed from letter section of the chart. Form is signed by Darice and has been faxed back to [employer].

## 2023-12-19 NOTE — Telephone Encounter (Signed)
 Clearance has been received from patient's PCP Darice Petty, NP on 12/18/23.  Patient is cleared to have procedure.  Thanks,  Selz, CMA

## 2023-12-24 ENCOUNTER — Ambulatory Visit: Admitting: Certified Registered"

## 2023-12-24 ENCOUNTER — Ambulatory Visit
Admission: RE | Admit: 2023-12-24 | Discharge: 2023-12-24 | Disposition: A | Discharge status: Home / Self Care | Attending: Gastroenterology | Admitting: Gastroenterology

## 2023-12-24 ENCOUNTER — Encounter: Payer: Self-pay | Admitting: Gastroenterology

## 2023-12-24 ENCOUNTER — Encounter
Admission: RE | Disposition: A | Discharge status: Home / Self Care | Payer: Self-pay | Source: Home / Self Care | Attending: Gastroenterology

## 2023-12-24 DIAGNOSIS — D124 Benign neoplasm of descending colon: Secondary | ICD-10-CM | POA: Diagnosis not present

## 2023-12-24 DIAGNOSIS — Z1211 Encounter for screening for malignant neoplasm of colon: Secondary | ICD-10-CM | POA: Diagnosis present

## 2023-12-24 DIAGNOSIS — Z79899 Other long term (current) drug therapy: Secondary | ICD-10-CM | POA: Diagnosis not present

## 2023-12-24 DIAGNOSIS — F17210 Nicotine dependence, cigarettes, uncomplicated: Secondary | ICD-10-CM | POA: Diagnosis not present

## 2023-12-24 DIAGNOSIS — K635 Polyp of colon: Secondary | ICD-10-CM | POA: Diagnosis not present

## 2023-12-24 DIAGNOSIS — F419 Anxiety disorder, unspecified: Secondary | ICD-10-CM | POA: Diagnosis not present

## 2023-12-24 DIAGNOSIS — D122 Benign neoplasm of ascending colon: Secondary | ICD-10-CM | POA: Diagnosis not present

## 2023-12-24 DIAGNOSIS — Z7951 Long term (current) use of inhaled steroids: Secondary | ICD-10-CM | POA: Insufficient documentation

## 2023-12-24 DIAGNOSIS — J449 Chronic obstructive pulmonary disease, unspecified: Secondary | ICD-10-CM | POA: Insufficient documentation

## 2023-12-24 DIAGNOSIS — F32A Depression, unspecified: Secondary | ICD-10-CM | POA: Insufficient documentation

## 2023-12-24 DIAGNOSIS — E785 Hyperlipidemia, unspecified: Secondary | ICD-10-CM | POA: Diagnosis not present

## 2023-12-24 DIAGNOSIS — K640 First degree hemorrhoids: Secondary | ICD-10-CM | POA: Diagnosis not present

## 2023-12-24 HISTORY — PX: POLYPECTOMY: SHX149

## 2023-12-24 HISTORY — PX: COLONOSCOPY: SHX5424

## 2023-12-24 SURGERY — COLONOSCOPY
Anesthesia: General

## 2023-12-24 MED ORDER — PROPOFOL 10 MG/ML IV BOLUS
INTRAVENOUS | Status: DC | PRN
  Administered 2023-12-24: 80 mg via INTRAVENOUS

## 2023-12-24 MED ORDER — SODIUM CHLORIDE 0.9 % IV SOLN: INTRAVENOUS | Status: DC

## 2023-12-24 MED ORDER — PROPOFOL 10 MG/ML IV BOLUS
INTRAVENOUS | Status: AC
  Filled 2023-12-24: qty 20

## 2023-12-24 MED ORDER — PROPOFOL 500 MG/50ML IV EMUL
INTRAVENOUS | Status: DC | PRN
Start: 2023-12-24 — End: 2023-12-24
  Administered 2023-12-24: 125 ug/kg/min via INTRAVENOUS

## 2023-12-24 MED ORDER — PHENYLEPHRINE 80 MCG/ML (10ML) SYRINGE FOR IV PUSH (FOR BLOOD PRESSURE SUPPORT)
PREFILLED_SYRINGE | INTRAVENOUS | Status: DC | PRN
  Administered 2023-12-24 (×2): 80 ug via INTRAVENOUS

## 2023-12-24 MED ORDER — LIDOCAINE HCL (CARDIAC) PF 100 MG/5ML IV SOSY
PREFILLED_SYRINGE | INTRAVENOUS | Status: DC | PRN
  Administered 2023-12-24: 60 mg via INTRAVENOUS

## 2023-12-24 MED ORDER — PROPOFOL 1000 MG/100ML IV EMUL
INTRAVENOUS | Status: AC
  Filled 2023-12-24: qty 100

## 2023-12-24 MED ORDER — LIDOCAINE HCL (PF) 2 % IJ SOLN
INTRAMUSCULAR | Status: AC
  Filled 2023-12-24: qty 5

## 2023-12-24 MED ORDER — PHENYLEPHRINE 80 MCG/ML (10ML) SYRINGE FOR IV PUSH (FOR BLOOD PRESSURE SUPPORT)
PREFILLED_SYRINGE | INTRAVENOUS | Status: AC
  Filled 2023-12-24: qty 10

## 2023-12-24 NOTE — Transfer of Care (Signed)
 Immediate Anesthesia Transfer of Care Note  Patient: Natalie Lawson  Procedure(s) Performed: COLONOSCOPY POLYPECTOMY, INTESTINE  Patient Location: PACU  Anesthesia Type:MAC  Level of Consciousness: drowsy  Airway & Oxygen Therapy: Patient Spontanous Breathing  Post-op Assessment: Report given to RN and Post -op Vital signs reviewed and stable  Post vital signs: stable  Last Vitals:  Vitals Value Taken Time  BP 103/60 12/24/23 08:12  Temp 35.9 C 12/24/23 08:12  Pulse 74 12/24/23 08:12  Resp 19 12/24/23 08:12  SpO2 100 % 12/24/23 08:12    Last Pain:  Vitals:   12/24/23 0812  TempSrc: Tympanic  PainSc: Asleep         Complications: No notable events documented.

## 2023-12-24 NOTE — Op Note (Signed)
 [facility] Gastroenterology Patient Name: Natalie Lawson Procedure Date: 12/24/2023 7:40 AM MRN: [id number] Account #: [id number] Date of Birth: 09-Jun-1960 Admit Type: Outpatient Age: 63 Room: [facility] ENDO ROOM 4 Gender: Female Note Status: Finalized Instrument Name: Colon Scope [id number] Procedure:             Colonoscopy Indications:           Screening for colorectal malignant neoplasm Providers:             Rogelia Copping MD, MD Referring MD:          Darice Petty (Referring MD) Medicines:             Propofol per Anesthesia Complications:         No immediate complications. Procedure:             Pre-Anesthesia Assessment:                        - Prior to the procedure, a History and Physical was                         performed, and patient medications and allergies were                         reviewed. The patient's tolerance of previous                         anesthesia was also reviewed. The risks and benefits                         of the procedure and the sedation options and risks                         were discussed with the patient. All questions were                         answered, and informed consent was obtained. Prior                         Anticoagulants: The patient has taken no anticoagulant                         or antiplatelet agents. ASA Grade Assessment: II - A                         patient with mild systemic disease. After reviewing                         the risks and benefits, the patient was deemed in                         satisfactory condition to undergo the procedure.                        After obtaining informed consent, the colonoscope was                         passed under direct vision. Throughout the procedure,  the patient's blood pressure, pulse, and oxygen                         saturations were monitored continuously. The was                         introduced through the anus and  advanced to the the                         cecum, identified by appendiceal orifice and ileocecal                         valve. The colonoscopy was performed without                         difficulty. The patient tolerated the procedure well.                         The quality of the bowel preparation was excellent. Findings:      The perianal and digital rectal examinations were normal.      A 20 mm polyp was found in the ascending colon. The polyp was sessile.       Preparations were made for mucosal resection. Chromoscopy with indigo       carmine was done. Demarcation of the lesion was performed during the       procedure to clearly identify the boundaries of the lesion. Saline with       indigo carmine was injected to raise the lesion. Snare mucosal resection       was performed. Resection and retrieval were complete. Resected tissue       margins were examined and clear of polyp tissue. To close a defect after       polypectomy, three hemostatic clips were successfully placed (MR       conditional). Clip manufacturer: [employer]. There was no       bleeding at the end of the procedure.      A 3 mm polyp was found in the ascending colon. The polyp was sessile.       The polyp was removed with a cold snare. Resection and retrieval were       complete.      A 3 mm polyp was found in the descending colon. The polyp was sessile.       The polyp was removed with a cold snare. Resection and retrieval were       complete.      Non-bleeding internal hemorrhoids were found. The hemorrhoids were Grade       I (internal hemorrhoids that do not prolapse). Impression:            - One 20 mm polyp in the ascending colon, removed with                         mucosal resection. Resected and retrieved. Clips (MR                         conditional) were placed. Clip manufacturer: [employer].                        -  One 3 mm polyp in the ascending colon,  removed with                         a cold snare. Resected and retrieved.                        - One 3 mm polyp in the descending colon, removed with                         a cold snare. Resected and retrieved.                        - Non-bleeding internal hemorrhoids.                        - Mucosal resection was performed. Resection and                         retrieval were complete. Recommendation:        - Discharge patient to home.                        - Resume previous diet.                        - Continue present medications.                        - Await pathology results.                        - Repeat colonoscopy in 1 year for surveillance. Procedure Code(s):     --- Professional ---                        [phone], Colonoscopy, flexible; with endoscopic mucosal                         resection                        [id number], 59, Colonoscopy, flexible; with removal of                         tumor(s), polyp(s), or other lesion(s) by snare                         technique Diagnosis Code(s):     --- Professional ---                        Z12.11, Encounter for screening for malignant neoplasm                         of colon                        D12.4, Benign neoplasm of descending colon CPT copyright 2022 American Medical Association. All rights reserved. The codes documented in this report are preliminary and upon coder review may  be revised to meet current compliance requirements. Rogelia Copping MD, MD 12/24/2023 8:11:40 AM This report has been signed electronically.  Number of Addenda: 0 Note Initiated On: 12/24/2023 7:40 AM Scope Withdrawal Time: 0 hours 17 minutes 40 seconds  Total Procedure Duration: 0 hours 22 minutes 3 seconds  Estimated Blood Loss:  Estimated blood loss: none.      [facility]

## 2023-12-24 NOTE — Anesthesia Preprocedure Evaluation (Signed)
 Anesthesia Evaluation  Patient identified by MRN, date of birth, ID band Patient awake    Reviewed: Allergy & Precautions, H&P , NPO status , Patient's Chart, lab work & pertinent test results, reviewed documented beta blocker date and time   Airway Mallampati: II   Neck ROM: full    Dental  (+) Poor Dentition   Pulmonary COPD,  COPD inhaler, Current SmokerPatient did not abstain from smoking.   Pulmonary exam normal        Cardiovascular Exercise Tolerance: Good negative cardio ROS Normal cardiovascular exam Rhythm:regular Rate:Normal     Neuro/Psych   Anxiety Depression    negative neurological ROS  negative psych ROS   GI/Hepatic negative GI ROS, Neg liver ROS,,,  Endo/Other  negative endocrine ROS    Renal/GU negative Renal ROS  negative genitourinary   Musculoskeletal   Abdominal   Peds  Hematology negative hematology ROS (+)   Anesthesia Other Findings Past Medical History: No date: Allergy No date: COPD (chronic obstructive pulmonary disease) (HCC) No date: Hyperlipidemia Past Surgical History: No date: NO PAST SURGERIES   Reproductive/Obstetrics negative OB ROS                              Anesthesia Physical Anesthesia Plan  ASA: 3  Anesthesia Plan: General   Post-op Pain Management:    Induction:   PONV Risk Score and Plan:   Airway Management Planned:   Additional Equipment:   Intra-op Plan:   Post-operative Plan:   Informed Consent: I have reviewed the patients History and Physical, chart, labs and discussed the procedure including the risks, benefits and alternatives for the proposed anesthesia with the patient or authorized representative who has indicated his/her understanding and acceptance.     Dental Advisory Given  Plan Discussed with: CRNA  Anesthesia Plan Comments:         Anesthesia Quick Evaluation

## 2023-12-24 NOTE — Anesthesia Postprocedure Evaluation (Signed)
 Anesthesia Post Note  Patient: Natalie Lawson  Procedure(s) Performed: COLONOSCOPY POLYPECTOMY, INTESTINE  Patient location during evaluation: PACU Anesthesia Type: General Level of consciousness: awake and alert Pain management: pain level controlled Vital Signs Assessment: post-procedure vital signs reviewed and stable Respiratory status: spontaneous breathing, nonlabored ventilation, respiratory function stable and patient connected to nasal cannula oxygen Cardiovascular status: blood pressure returned to baseline and stable Postop Assessment: no apparent nausea or vomiting Anesthetic complications: no   No notable events documented.   Last Vitals:  Vitals:   12/24/23 0822 12/24/23 0831  BP: 114/74 122/74  Pulse: 65 65  Resp: (!) 21 20  Temp:    SpO2: 100% 100%    Last Pain:  Vitals:   12/24/23 0831  TempSrc:   PainSc: 0-No pain                 Lynwood KANDICE Clause

## 2023-12-24 NOTE — H&P (Signed)
 Natalie Copping, MD [facility] [address], [address] [place], [place] [address] Phone: [phone] Fax : [phone]  Primary Care Physician:  Melvin Pao, NP Primary Gastroenterologist:  Dr. Copping  Pre-Procedure History & Physical: HPI:  Natalie Lawson is a 63 y.o. female is here for a screening colonoscopy.   Past Medical History:  Diagnosis Date   Allergy    COPD (chronic obstructive pulmonary disease) (HCC)    Hyperlipidemia     Past Surgical History:  Procedure Laterality Date   NO PAST SURGERIES      Prior to Admission medications   Medication Sig Start Date End Date Taking? Authorizing Provider  escitalopram  (LEXAPRO ) 20 MG tablet Take 1 tablet (20 mg total) by mouth daily. 10/29/23  Yes Melvin Pao, NP  budesonide -formoterol  (SYMBICORT ) 160-4.5 MCG/ACT inhaler Inhale 2 puffs into the lungs 2 (two) times daily. 10/29/23   Melvin Pao, NP  Multiple Vitamin (MULTIVITAMIN) tablet Take 1 tablet by mouth daily.    [provider]  rosuvastatin  (CRESTOR ) 20 MG tablet Take 1 tablet (20 mg total) by mouth daily. 10/29/23   Melvin Pao, NP  Vitamin D , Cholecalciferol, 25 MCG (1000 UT) CAPS 1 capsule Patient not taking: Reported on 03/07/2023    [provider]    Allergies as of 09/18/2023   (No Known Allergies)    Family History  Problem Relation Age of Onset   Brain cancer Mother    Asthma Father    Breast cancer Sister    Healthy Sister    Cancer Brother     Social History   Socioeconomic History   Marital status: Single    Spouse name: Not on file   Number of children: Not on file   Years of education: Not on file   Highest education level: Not on file  Occupational History   Not on file  Tobacco Use   Smoking status: Every Day    Current packs/day: 0.75    Types: Cigarettes   Smokeless tobacco: Never  Vaping Use   Vaping status: Never Used  Substance and Sexual Activity   Alcohol use: No   Drug use: No   Sexual  activity: Never  Other Topics Concern   Not on file  Social History Narrative   Not on file   Social Drivers of Health   Financial Resource Strain: Low Risk  (01/22/2023)   Received from [facility]   Overall Financial Resource Strain (CARDIA)    Difficulty of Paying Living Expenses: Not very hard  Food Insecurity: No Food Insecurity (01/22/2023)   Received from [facility]   Hunger Vital Sign    Within the past 12 months, you worried that your food would run out before you got the money to buy more.: Never true    Within the past 12 months, the food you bought just didn't last and you didn't have money to get more.: Never true  Transportation Needs: No Transportation Needs (01/22/2023)   Received from [facility]    In the past 12 months, has lack of transportation kept you from medical appointments or from getting medications?: No    Lack of Transportation (Non-Medical): No  Physical Activity: Not on file  Stress: Not on file  Social Connections: Unknown (07/18/2021)   Received [facility]   Social Network    Social Network: Not on file  Intimate Partner Violence: Unknown (06/09/2021)   Received from [facility]  HITS    Physically Hurt: Not on file    Insult or Talk Down To: Not on file    Threaten Physical Harm: Not on file    Scream or Curse: Not on file    Review of Systems: See HPI, otherwise negative ROS  Physical Exam: BP (!) 112/59   Pulse 79   Temp (!) 96.4 F (35.8 C) (Temporal)   Resp 16   Ht 5' 3 (1.6 m)   Wt 42.2 kg   LMP 12/29/2007   SpO2 96%   BMI 16.47 kg/m  General:   Alert,  pleasant and cooperative in NAD Head:  Normocephalic and atraumatic. Neck:  Supple; no masses or thyromegaly. Lungs:  Clear throughout to auscultation.    Heart:  Regular rate and rhythm. Abdomen:  Soft, nontender and nondistended. Normal bowel sounds, without guarding, and without  rebound.   Neurologic:  Alert and  oriented x4;  grossly normal neurologically.  Impression/Plan: Natalie Lawson is now here to undergo a screening colonoscopy.  Risks, benefits, and alternatives regarding colonoscopy have been reviewed with the patient.  Questions have been answered.  All parties agreeable.

## 2023-12-25 LAB — SURGICAL PATHOLOGY

## 2023-12-26 ENCOUNTER — Ambulatory Visit: Payer: Self-pay | Admitting: Gastroenterology

## 2024-03-19 ENCOUNTER — Encounter: Payer: Self-pay | Admitting: Nurse Practitioner

## 2024-03-19 ENCOUNTER — Ambulatory Visit: Admitting: Nurse Practitioner

## 2024-03-19 VITALS — BP 123/79 | HR 76 | Temp 97.5°F | Ht 62.99 in | Wt 99.8 lb

## 2024-03-19 DIAGNOSIS — E7800 Pure hypercholesterolemia, unspecified: Secondary | ICD-10-CM | POA: Diagnosis not present

## 2024-03-19 DIAGNOSIS — F418 Other specified anxiety disorders: Secondary | ICD-10-CM | POA: Diagnosis not present

## 2024-03-19 DIAGNOSIS — Z1231 Encounter for screening mammogram for malignant neoplasm of breast: Secondary | ICD-10-CM

## 2024-03-19 DIAGNOSIS — J449 Chronic obstructive pulmonary disease, unspecified: Secondary | ICD-10-CM | POA: Diagnosis not present

## 2024-03-19 DIAGNOSIS — Z0000 Encounter for general adult medical examination without abnormal findings: Secondary | ICD-10-CM

## 2024-03-19 DIAGNOSIS — Z23 Encounter for immunization: Secondary | ICD-10-CM

## 2024-03-19 MED ORDER — SERTRALINE HCL 25 MG PO TABS: 25.0000 mg | ORAL_TABLET | Freq: Every day | ORAL | 0 refills | Status: AC

## 2024-03-19 MED ORDER — ESCITALOPRAM OXALATE 20 MG PO TABS: 20.0000 mg | ORAL_TABLET | Freq: Every day | ORAL | 1 refills | Status: DC

## 2024-03-19 MED ORDER — ALBUTEROL SULFATE HFA 108 (90 BASE) MCG/ACT IN AERS: 2.0000 | INHALATION_SPRAY | Freq: Four times a day (QID) | RESPIRATORY_TRACT | 1 refills | Status: AC | PRN

## 2024-03-19 MED ORDER — BUDESONIDE-FORMOTEROL FUMARATE 160-4.5 MCG/ACT IN AERO: 2.0000 | INHALATION_SPRAY | Freq: Two times a day (BID) | RESPIRATORY_TRACT | 3 refills | Status: AC

## 2024-03-19 MED ORDER — ROSUVASTATIN CALCIUM 20 MG PO TABS: 20.0000 mg | ORAL_TABLET | Freq: Every day | ORAL | 1 refills | Status: AC

## 2024-03-19 NOTE — Patient Instructions (Signed)
 You have an order for:  []   2D Mammogram  [x]   3D Mammogram  []   Bone Density     Please call for appointment:  [facility]  [address] [place] [address] [phone] [facility] [address] [place], [place] [address] [phone] [facility] [address] [place], [place] [address] [phone]   Make sure to wear two-piece clothing.  No lotions, powders, or deodorants the day of the appointment. Make sure to bring picture ID and insurance card.  Bring list of medications you are currently taking including any supplements.   Schedule your [place] screening mammogram through MyChart!   Log into your MyChart account.  Go to 'Visit' (or 'Appointments' if on mobile App) --> Schedule an Appointment  Under 'Select a Reason for Visit' choose the Mammogram Screening option.  Complete the pre-visit questions and select the time and place that best fits your schedule.

## 2024-03-19 NOTE — Progress Notes (Signed)
 "  BP 123/79 (BP Location: Right Arm, Patient Position: Sitting, Cuff Size: Small)   Pulse 76   Temp (!) 97.5 F (36.4 C) (Oral)   Ht 5' 2.99 (1.6 m)   Wt 99 lb 12.8 oz (45.3 kg)   LMP 12/29/2007   SpO2 93%   BMI 17.68 kg/m    Subjective:    Patient ID: Natalie Lawson, female    DOB: 07-27-1960, 64 y.o.   MRN: [id number]  HPI: Natalie Lawson is a 64 y.o. female presenting on 03/19/2024 for comprehensive medical examination. Current medical complaints include: None  She currently lives with: Menopausal Symptoms: no  HYPERLIPIDEMIA Hyperlipidemia status: excellent compliance Satisfied with current treatment?  yes Side effects:  no Medication compliance: excellent compliance Past cholesterol meds: rosuvastatin  (crestor ) Supplements: none Aspirin:  no The ASCVD Risk score (Arnett DK, et al., 2019) failed to calculate for the following reasons:   The valid HDL cholesterol range is 20 to 100 mg/dL Chest pain:  no Coronary artery disease:  no Family history CAD:  no Family history early CAD:  no She feels like her brain is cloudy when she takes the full 20mg .  She has been cutting it in half and tolerating it well.   COPD Feels like her breathing has been good.  She is using her Symbicort  int he morning and evening. She is still trying to quit smoking. Smoking about 10 cigarettes per day. COPD status: controlled Satisfied with current treatment?: yes Oxygen use: no Dyspnea frequency: with activity Cough frequency: some Rescue inhaler frequency: not using Limitation of activity: yes Productive cough: no Last Spirometry: none Pneumovax: Up to Date Influenza: Up to Date    Denies HA, CP, dizziness, palpitations, visual changes, and lower extremity swelling.  MOOD Patient states she feels like she is sleeping too much and does have some moments when she is down. She is currently taking lexapro  20mg  daily.  She would be okay with changing the medication.  Has taken Wellbutrin for  smoking cessation in the past but it did not improve her mood.  Denies SI.    Depression Screen done today and results listed below:     03/19/2024   10:25 AM 12/02/2023   10:30 AM 09/16/2023    2:02 PM 03/07/2023    3:00 PM 07/14/2021    8:51 AM  Depression screen PHQ 2/9  Decreased Interest 2 1 1 2 1   Down, Depressed, Hopeless 1 1 0 2 1  PHQ - 2 Score 3 2 1 4 2   Altered sleeping 1 1 0 2 0  Tired, decreased energy 1 0 1 3 1   Change in appetite 0 0 0 0 0  Feeling bad or failure about yourself  0 0 0 0 0  Trouble concentrating 1 1 1 1 3   Moving slowly or fidgety/restless 0 0 0 0 0  Suicidal thoughts 0 0 0 0 0  PHQ-9 Score 6 4  3  10  6    Difficult doing work/chores Somewhat difficult Somewhat difficult Somewhat difficult  Somewhat difficult     Data saved with a previous flowsheet row definition    The patient does not have a history of falls. I did complete a risk assessment for falls. A plan of care for falls was documented.   Past Medical History:  Past Medical History:  Diagnosis Date   Allergy    COPD (chronic obstructive pulmonary disease) (HCC)    Hyperlipidemia     Surgical History:  Past Surgical  History:  Procedure Laterality Date   COLONOSCOPY N/A 12/24/2023   Procedure: COLONOSCOPY;  Surgeon: Jinny Carmine, MD;  Location: [facility] ENDOSCOPY;  Service: Endoscopy;  Laterality: N/A;   NO PAST SURGERIES     POLYPECTOMY  12/24/2023   Procedure: POLYPECTOMY, INTESTINE;  Surgeon: Jinny Carmine, MD;  Location: [facility] ENDOSCOPY;  Service: Endoscopy;;    Medications:  Current Outpatient Medications on File Prior to Visit  Medication Sig   Multiple Vitamin (MULTIVITAMIN) tablet Take 1 tablet by mouth daily.   No current facility-administered medications on file prior to visit.    Allergies:  No Known Allergies  Social History:  Social History   Socioeconomic History   Marital status: Single    Spouse name: Not on file   Number of children: Not on file   Years of  education: Not on file   Highest education level: Not on file  Occupational History   Not on file  Tobacco Use   Smoking status: Every Day    Current packs/day: 0.75    Types: Cigarettes   Smokeless tobacco: Never  Vaping Use   Vaping status: Never Used  Substance and Sexual Activity   Alcohol use: No   Drug use: No   Sexual activity: Never  Other Topics Concern   Not on file  Social History Narrative   Not on file   Social Drivers of Health   Tobacco Use: High Risk (03/19/2024)   Patient History    Smoking Tobacco Use: Every Day    Smokeless Tobacco Use: Never    Passive Exposure: Not on file  Financial Resource Strain: Low Risk  (01/22/2023)   Received from [facility]   Overall Financial Resource Strain (CARDIA)    Difficulty of Paying Living Expenses: Not very hard  Food Insecurity: No Food Insecurity (01/22/2023)   Received from [facility]   Epic    Within the past 12 months, you worried that your food would run out before you got the money to buy more.: Never true    Within the past 12 months, the food you bought just didn't last and you didn't have money to get more.: Never true  Transportation Needs: No Transportation Needs (01/22/2023)   Received from [facility] Transportation    In the past 12 months, has lack of transportation kept you from medical appointments or from getting medications?: No    Lack of Transportation (Non-Medical): No  Physical Activity: Not on file  Stress: Not on file  Social Connections: Not on file  Intimate Partner Violence: Not on file  Depression (PHQ2-9): Medium Risk (03/19/2024)   Depression (PHQ2-9)    PHQ-2 Score: 6  Alcohol Screen: Not on file  Housing: Low Risk  (08/12/2023)   Received from [facility]   Epic    At any time in the past 12 months, were you homeless or living in a shelter (including now)?: No    In the past 12 months, how many  times have you moved where you were living?: 0    In the last 12 months, was there a time when you were not able to pay the mortgage or rent on time?: No  Utilities: Not At Risk (01/22/2023)   Received from [facility] Utilities    Threatened with loss of utilities: No  Health Literacy: Not on file   Social History   Tobacco Use  Smoking  Status Every Day   Current packs/day: 0.75   Types: Cigarettes  Smokeless Tobacco Never   Social History   Substance and Sexual Activity  Alcohol Use No    Family History:  Family History  Problem Relation Age of Onset   Brain cancer Mother    Asthma Father    Breast cancer Sister    Healthy Sister    Cancer Brother     Past medical history, surgical history, medications, allergies, family history and social history reviewed with patient today and changes made to appropriate areas of the chart.   Review of Systems  Eyes:  Negative for blurred vision and double vision.  Respiratory:  Negative for shortness of breath.   Cardiovascular:  Negative for chest pain, palpitations and leg swelling.  Neurological:  Negative for dizziness and headaches.  Psychiatric/Behavioral:  Positive for depression. Negative for suicidal ideas.    All other ROS negative except what is listed above and in the HPI.      Objective:    BP 123/79 (BP Location: Right Arm, Patient Position: Sitting, Cuff Size: Small)   Pulse 76   Temp (!) 97.5 F (36.4 C) (Oral)   Ht 5' 2.99 (1.6 m)   Wt 99 lb 12.8 oz (45.3 kg)   LMP 12/29/2007   SpO2 93%   BMI 17.68 kg/m   Wt Readings from Last 3 Encounters:  03/19/24 99 lb 12.8 oz (45.3 kg)  12/24/23 93 lb (42.2 kg)  12/02/23 94 lb 9.6 oz (42.9 kg)    Physical Exam Vitals and nursing note reviewed.  Constitutional:      General: She is awake. She is not in acute distress.    Appearance: Normal appearance. She is well-developed. She is not ill-appearing.  HENT:     Head: Normocephalic and  atraumatic.     Right Ear: Hearing, tympanic membrane, ear canal and external ear normal. No drainage.     Left Ear: Hearing, tympanic membrane, ear canal and external ear normal. No drainage.     Nose: Nose normal.     Right Sinus: No maxillary sinus tenderness or frontal sinus tenderness.     Left Sinus: No maxillary sinus tenderness or frontal sinus tenderness.     Mouth/Throat:     Mouth: Mucous membranes are moist.     Pharynx: Oropharynx is clear. Uvula midline. No pharyngeal swelling, oropharyngeal exudate or posterior oropharyngeal erythema.  Eyes:     General: Lids are normal.        Right eye: No discharge.        Left eye: No discharge.     Extraocular Movements: Extraocular movements intact.     Conjunctiva/sclera: Conjunctivae normal.     Pupils: Pupils are equal, round, and reactive to light.     Visual Fields: Right eye visual fields normal and left eye visual fields normal.  Neck:     Thyroid : No thyromegaly.     Vascular: No carotid bruit.     Trachea: Trachea normal.  Cardiovascular:     Rate and Rhythm: Normal rate and regular rhythm.     Heart sounds: Normal heart sounds. No murmur heard.    No gallop.  Pulmonary:     Effort: Pulmonary effort is normal. No accessory muscle usage or respiratory distress.     Breath sounds: Normal breath sounds.  Chest:  Breasts:    Right: Normal.     Left: Normal.  Abdominal:     General: Bowel sounds are normal.  Palpations: Abdomen is soft. There is no hepatomegaly or splenomegaly.     Tenderness: There is no abdominal tenderness.  Musculoskeletal:        General: Normal range of motion.     Cervical back: Normal range of motion and neck supple.     Right lower leg: No edema.     Left lower leg: No edema.  Lymphadenopathy:     Head:     Right side of head: No submental, submandibular, tonsillar, preauricular or posterior auricular adenopathy.     Left side of head: No submental, submandibular, tonsillar,  preauricular or posterior auricular adenopathy.     Cervical: No cervical adenopathy.     Upper Body:     Right upper body: No supraclavicular, axillary or pectoral adenopathy.     Left upper body: No supraclavicular, axillary or pectoral adenopathy.  Skin:    General: Skin is warm and dry.     Capillary Refill: Capillary refill takes less than 2 seconds.     Findings: No rash.  Neurological:     Mental Status: She is alert and oriented to person, place, and time.     Gait: Gait is intact.  Psychiatric:        Attention and Perception: Attention normal.        Mood and Affect: Mood normal.        Speech: Speech normal.        Behavior: Behavior normal. Behavior is cooperative.        Thought Content: Thought content normal.        Judgment: Judgment normal.     Results for orders placed or performed during the hospital encounter of 12/24/23  Surgical pathology   Collection Time: 12/24/23 12:00 AM  Result Value Ref Range   SURGICAL PATHOLOGY      SURGICAL PATHOLOGY [facility] [address] [place], [place] [address] Telephone [phone] or [phone] Fax [phone]  REPORT OF SURGICAL PATHOLOGY   Accession #: [id number] Patient Name: DANAY, MCKELLAR Visit # : [id number]  MRN: [id number] Physician: Jinny Carmine DOB/Age 10-Mar-1960 (Age: 60) Gender: F Collected Date: 12/24/2023 Received Date: 12/24/2023  FINAL DIAGNOSIS       1. Ascending  Colon Polyp, hot snare :       - TUBULAR ADENOMA.       2. Ascending  Colon Polyp, cold snare :       - BENIGN COLONIC MUCOSA WITH SURFACE HYPERPLASTIC CHANGES.       3. Descending Colon Polyp, cold snare :       - TUBULAR ADENOMA.       ELECTRONIC SIGNATURE : Coronel Md, Misti, Sports Administrator, International Aid/development Worker  MICROSCOPIC DESCRIPTION  CASE COMMENTS STAINS USED IN DIAGNOSIS: H&E H&E H&E H&E H&E    CLINICAL HISTORY  SPECIMEN(S) OBTAINED 1. Ascending  Colon Polyp, Hot  Snare 2. Ascending  Colon Polyp, Cold Sn are 3. Descending Colon Polyp, Cold Snare  SPECIMEN COMMENTS: SPECIMEN CLINICAL INFORMATION: 1. Screening colonoscopy.Colon polyp    Gross Description 1. Received in formalin are tan, soft tissue fragments that are submitted in toto.Number:  multiple ,  Size:  0.1 cm smallest to 0.5 cm largest, 3 blocks. 2. Received in formalin is a tan, soft tissue fragment that is submitted in toto.Size:  0.4 cm, 1 block submitted. 3. Received in formalin are tan, soft tissue fragments that are submitted in toto.Number:  two  Size:  0.6 cm, 1 block.mb 12-24-23  Report signed out from the following location(s) [facility] [address] [place], [place] [address] CLIA #: [id number]  [facility] [address] [place], [place] [address] CLIA #: [id number]       Assessment & Plan:   Problem List Items Addressed This Visit       Respiratory   COPD (chronic obstructive pulmonary disease) (HCC)   Chronic.  Controlled.  Continue with current medication regimen of Symbicort . Refill of albuterol  given.  Patient does not currently have one.  Labs ordered today.  Return to clinic in 6 months for reevaluation.  Call sooner if concerns arise.       Relevant Medications   albuterol  (VENTOLIN  HFA) 108 (90 Base) MCG/ACT inhaler   budesonide -formoterol  (SYMBICORT ) 160-4.5 MCG/ACT inhaler     Other   Hypercholesteremia   Chronic.  Controlled.  Continue with current medication regimen of Rosuvstatin daily.  Labs ordered today.  Return to clinic in 6 months for reevaluation.  Call sooner if concerns arise.        Relevant Medications   rosuvastatin  (CRESTOR ) 20 MG tablet   Other Relevant Orders   Lipid panel   Mixed anxiety and depressive disorder   Chronic. Ongoing. PHQ9 of 6 in office today. Will change from Lexapro  to Zoloft  25mg  daily.  Side effects and benefits discussed during visit.  Follow up in 1 month.   Call sooner if concerns arise.       Relevant Medications   sertraline  (ZOLOFT ) 25 MG tablet   Other Visit Diagnoses       Annual physical exam    -  Primary   Health maintenance reviewed during visit today.  Labs ordered.  Vaccines reviewed.  COlonoscopy and PAP up to date.  Mammogram ordered.   Relevant Orders   CBC with Differential/Platelet   Comprehensive metabolic panel with GFR   Lipid panel   TSH     Flu vaccine need       Relevant Orders   Flu vaccine trivalent PF, 6mos and older(Flulaval,Afluria,Fluarix,Fluzone) (Completed)     Encounter for screening mammogram for malignant neoplasm of breast            Follow up plan: Return in about 1 month (around 04/19/2024) for Depression/Anxiety FU.   LABORATORY TESTING:  - Pap smear: up to date  IMMUNIZATIONS:   - Tdap: Tetanus vaccination status reviewed: last tetanus booster within 10 years. - Influenza: Administered today - Pneumovax: Up to date - Prevnar: Not applicable - COVID: Not applicable - HPV: Not applicable - Shingrix vaccine: Will get at future visit  SCREENING: -Mammogram: Ordered today  - Colonoscopy: Ordered today  - Bone Density: Not applicable  -Hearing Test: Not applicable  -Spirometry: Not applicable   PATIENT COUNSELING:   Advised to take 1 mg of folate supplement per day if capable of pregnancy.   Sexuality: Discussed sexually transmitted diseases, partner selection, use of condoms, avoidance of unintended pregnancy  and contraceptive alternatives.   Advised to avoid cigarette smoking.  I discussed with the patient that most people either abstain from alcohol or drink within safe limits (<=14/week and <=4 drinks/occasion for males, <=7/weeks and <= 3 drinks/occasion for females) and that the risk for alcohol disorders and other health effects rises proportionally with the number of drinks per week and how often a drinker exceeds daily limits.  Discussed cessation/primary prevention of drug  use and availability of treatment for abuse.   Diet: Encouraged to adjust  caloric intake to maintain  or achieve ideal body weight, to reduce intake of dietary saturated fat and total fat, to limit sodium intake by avoiding high sodium foods and not adding table salt, and to maintain adequate dietary potassium and calcium  preferably from fresh fruits, vegetables, and low-fat dairy products.    stressed the importance of regular exercise  Injury prevention: Discussed safety belts, safety helmets, smoke detector, smoking near bedding or upholstery.   Dental health: Discussed importance of regular tooth brushing, flossing, and dental visits.    NEXT PREVENTATIVE PHYSICAL DUE IN 1 YEAR. Return in about 1 month (around 04/19/2024) for Depression/Anxiety FU.          "

## 2024-03-19 NOTE — Assessment & Plan Note (Signed)
 Chronic. Ongoing. PHQ9 of 6 in office today. Will change from Lexapro  to Zoloft  25mg  daily.  Side effects and benefits discussed during visit.  Follow up in 1 month.  Call sooner if concerns arise.

## 2024-03-19 NOTE — Assessment & Plan Note (Signed)
 Chronic.  Controlled.  Continue with current medication regimen of Rosuvstatin daily.  Labs ordered today.  Return to clinic in 6 months for reevaluation.  Call sooner if concerns arise.

## 2024-03-19 NOTE — Assessment & Plan Note (Signed)
 Chronic.  Controlled.  Continue with current medication regimen of Symbicort . Refill of albuterol  given.  Patient does not currently have one.  Labs ordered today.  Return to clinic in 6 months for reevaluation.  Call sooner if concerns arise.

## 2024-03-20 ENCOUNTER — Ambulatory Visit: Payer: Self-pay | Admitting: Nurse Practitioner

## 2024-03-20 ENCOUNTER — Telehealth: Payer: Self-pay | Admitting: Nurse Practitioner

## 2024-03-20 LAB — CBC WITH DIFFERENTIAL/PLATELET
Basophils Absolute: 0 x10E3/uL (ref 0.0–0.2)
Basos: 0 %
EOS (ABSOLUTE): 0.4 x10E3/uL (ref 0.0–0.4)
Eos: 5 %
Hematocrit: 44.5 % (ref 34.0–46.6)
Hemoglobin: 13.9 g/dL (ref 11.1–15.9)
Immature Grans (Abs): 0 x10E3/uL (ref 0.0–0.1)
Immature Granulocytes: 0 %
Lymphocytes Absolute: 2.1 x10E3/uL (ref 0.7–3.1)
Lymphs: 24 %
MCH: 28.7 pg (ref 26.6–33.0)
MCHC: 31.2 g/dL — ABNORMAL LOW (ref 31.5–35.7)
MCV: 92 fL (ref 79–97)
Monocytes Absolute: 0.7 x10E3/uL (ref 0.1–0.9)
Monocytes: 8 %
Neutrophils Absolute: 5.3 x10E3/uL (ref 1.4–7.0)
Neutrophils: 63 %
Platelets: 234 x10E3/uL (ref 150–450)
RBC: 4.85 x10E6/uL (ref 3.77–5.28)
RDW: 13.4 % (ref 11.7–15.4)
WBC: 8.5 x10E3/uL (ref 3.4–10.8)

## 2024-03-20 LAB — LIPID PANEL
Chol/HDL Ratio: 2 ratio (ref 0.0–4.4)
Cholesterol, Total: 221 mg/dL — ABNORMAL HIGH (ref 100–199)
HDL: 112 mg/dL
LDL Chol Calc (NIH): 98 mg/dL (ref 0–99)
Triglycerides: 61 mg/dL (ref 0–149)
VLDL Cholesterol Cal: 11 mg/dL (ref 5–40)

## 2024-03-20 LAB — COMPREHENSIVE METABOLIC PANEL WITH GFR
ALT: 22 IU/L (ref 0–32)
AST: 33 IU/L (ref 0–40)
Albumin: 4.6 g/dL (ref 3.9–4.9)
Alkaline Phosphatase: 55 IU/L (ref 49–135)
BUN/Creatinine Ratio: 18 (ref 12–28)
BUN: 12 mg/dL (ref 8–27)
Bilirubin Total: 0.3 mg/dL (ref 0.0–1.2)
CO2: 26 mmol/L (ref 20–29)
Calcium: 9.8 mg/dL (ref 8.7–10.3)
Chloride: 102 mmol/L (ref 96–106)
Creatinine, Ser: 0.66 mg/dL (ref 0.57–1.00)
Globulin, Total: 2 g/dL (ref 1.5–4.5)
Glucose: 91 mg/dL (ref 70–99)
Potassium: 4.2 mmol/L (ref 3.5–5.2)
Sodium: 143 mmol/L (ref 134–144)
Total Protein: 6.6 g/dL (ref 6.0–8.5)
eGFR: 99 mL/min/1.73

## 2024-03-20 LAB — TSH: TSH: 4.17 u[IU]/mL (ref 0.450–4.500)

## 2024-03-20 NOTE — Telephone Encounter (Signed)
 Routing to provider to advise since comment from pharmacist.

## 2024-03-20 NOTE — Telephone Encounter (Signed)
"   Patient return call and has been made aware of chart notations. Patient states she spoke with the pharmacist and was advised to wean herself off the Lexapro  and that it's not recommended to abruptly stop taken medication; please contact patient to further advise.   CB# [phone] "

## 2024-03-20 NOTE — Telephone Encounter (Signed)
 I called the patient to address her concerns. If she calls back in please inform her that the provider stated she can start Zoloft  at any time and stop the Lexapro . The prescription has been sent in to her  pharmacy. It's ok for E2C2 to speak with her as well.

## 2024-03-20 NOTE — Telephone Encounter (Signed)
 Copied from CRM [id number]. Topic: Clinical - Medication Question >> Mar 20, 2024 10:30 AM Chasity T wrote: Reason for CRM: patient is requesting for Dr Darice CMA to give her a call for questions on how to takeherself off of the lexapro  to start the new medication zoloft .   Phone number: [phone]

## 2024-03-23 NOTE — Telephone Encounter (Signed)
 Called and spoke to patient. She states that she has all of this straightened out now.

## 2024-03-23 NOTE — Telephone Encounter (Signed)
 She is not abruptly stopping the medication, she is starting another one in the same class.   However, if she would like to wean off the medication she can take 1/2 pill for 1 week then 1/2 every other day for 1 week and stop the medication.  She can start the Zoloft  when she has stopped the Lexapro .

## 2024-04-20 ENCOUNTER — Ambulatory Visit: Admitting: Nurse Practitioner

## 2024-09-16 ENCOUNTER — Ambulatory Visit: Admitting: Nurse Practitioner
